# Patient Record
Sex: Male | Born: 1990 | Race: White | Hispanic: No | Marital: Single | Smoking: Former smoker
Health system: Southern US, Community
[De-identification: ages and names within clinical notes are randomized; demographics above are authoritative.]

## PROBLEM LIST (undated history)

## (undated) DIAGNOSIS — T7840XA Allergy, unspecified, initial encounter: Secondary | ICD-10-CM

## (undated) DIAGNOSIS — J45909 Unspecified asthma, uncomplicated: Secondary | ICD-10-CM

## (undated) DIAGNOSIS — L709 Acne, unspecified: Secondary | ICD-10-CM

## (undated) DIAGNOSIS — F17200 Nicotine dependence, unspecified, uncomplicated: Secondary | ICD-10-CM

## (undated) DIAGNOSIS — F419 Anxiety disorder, unspecified: Secondary | ICD-10-CM

## (undated) DIAGNOSIS — J189 Pneumonia, unspecified organism: Secondary | ICD-10-CM

## (undated) DIAGNOSIS — F341 Dysthymic disorder: Secondary | ICD-10-CM

## (undated) DIAGNOSIS — F909 Attention-deficit hyperactivity disorder, unspecified type: Secondary | ICD-10-CM

## (undated) HISTORY — DX: Allergy, unspecified, initial encounter: T78.40XA

## (undated) HISTORY — DX: Acne, unspecified: L70.9

## (undated) HISTORY — DX: Pneumonia, unspecified organism: J18.9

## (undated) HISTORY — DX: Anxiety disorder, unspecified: F41.9

## (undated) HISTORY — DX: Dysthymic disorder: F34.1

## (undated) HISTORY — DX: Attention-deficit hyperactivity disorder, unspecified type: F90.9

## (undated) HISTORY — DX: Nicotine dependence, unspecified, uncomplicated: F17.200

## (undated) HISTORY — DX: Unspecified asthma, uncomplicated: J45.909

---
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
## 2011-09-17 ENCOUNTER — Encounter (HOSPITAL_COMMUNITY): Payer: Self-pay

## 2011-09-17 ENCOUNTER — Emergency Department (HOSPITAL_COMMUNITY)
Admission: EM | Admit: 2011-09-17 | Discharge: 2011-09-17 | Disposition: A | Discharge status: Home / Self Care | Payer: 59 | Source: Home / Self Care

## 2011-09-17 DIAGNOSIS — J40 Bronchitis, not specified as acute or chronic: Secondary | ICD-10-CM

## 2011-09-17 LAB — POCT INFECTIOUS MONO SCREEN: Mono Screen: NEGATIVE

## 2011-09-17 MED ORDER — GUAIFENESIN-DM 100-10 MG/5ML PO SYRP: 5.0000 mL | ORAL_SOLUTION | Freq: Three times a day (TID) | ORAL | Status: AC | PRN

## 2011-09-17 MED ORDER — ONDANSETRON 4 MG PO TBDP: 4.0000 mg | ORAL_TABLET | Freq: Three times a day (TID) | ORAL | Status: AC | PRN

## 2011-09-17 MED ORDER — AZITHROMYCIN 250 MG PO TABS: 250.0000 mg | ORAL_TABLET | Freq: Every day | ORAL | Status: DC

## 2011-09-17 NOTE — Discharge Instructions (Signed)
Bronchitis Bronchitis is a problem of the air tubes leading to your lungs. This problem makes it hard for air to get in and out of the lungs. You may cough a lot because your air tubes are narrow. Going without care can cause lasting (chronic) bronchitis. HOME CARE   Drink enough fluids to keep your pee (urine) clear or pale yellow.   Use a cool mist humidifier.   Quit smoking if you smoke. If you keep smoking, the bronchitis might not get better.   Only take medicine as told by your doctor.  GET HELP RIGHT AWAY IF:   Coughing keeps you awake.   You start to wheeze.   You become more sick or weak.   You have a hard time breathing or get short of breath.   You cough up blood.   Coughing lasts more than 2 weeks.   You have a fever.   Your baby is older than 3 months with a rectal temperature of 102 F (38.9 C) or higher.   Your baby is 3 months old or younger with a rectal temperature of 100.4 F (38 C) or higher.  MAKE SURE YOU:  Understand these instructions.   Will watch your condition.   Will get help right away if you are not doing well or get worse.  Document Released: 09/14/2007 Document Revised: 03/17/2011 Document Reviewed: 02/27/2009 [employer] Patient Information 2012 [employer]. 

## 2011-09-17 NOTE — ED Notes (Signed)
Pt has fever, cough, vomiting and sorethroat that started at 3 pm today.

## 2011-09-17 NOTE — ED Provider Notes (Signed)
History     CSN: [id number]  Arrival date & time 09/17/11  1703   First MD Initiated Contact with Patient 09/17/11 1740      Chief Complaint  Patient presents with  . Sore Throat    (Consider location/radiation/quality/duration/timing/severity/associated sxs/prior treatment) HPI 21 year old male with history of depression presented with low-grade fever and malaise since yesterday.  Today he started having a cough which in turn caused him to have a vomiting episode.  He reports having productive sputum with phlegm.  He does complain of being slightly short of breath and weak.  Denies chest pain.  Did have diarrhea yesterday.  Reports that he smokes.  History reviewed. No pertinent past medical history.  History reviewed. No pertinent past surgical history.  History reviewed. No pertinent family history.  History  Substance Use Topics  . Smoking status: Current Everyday Smoker  . Smokeless tobacco: Not on file  . Alcohol Use: No      Review of Systems  Constitutional: Positive for fever and chills.  HENT: Positive for sore throat and trouble swallowing.   Eyes: Negative.   Respiratory: Positive for cough.   Cardiovascular: Negative.   Gastrointestinal: Negative.   Genitourinary: Negative.   Musculoskeletal: Negative.   Skin: Negative.   Neurological: Negative.   Hematological: Negative.   Psychiatric/Behavioral: Negative.     Allergies  Review of patient's allergies indicates no known allergies.  Home Medications   Current Outpatient Rx  Name Route Sig Dispense Refill  . VITAMIN D 1000 UNITS PO TABS Oral Take 1,000 Units by mouth daily.    Marland Kitchen MINOCYCLINE HCL 50 MG PO TABS Oral Take 50 mg by mouth 2 (two) times daily.    Marland Kitchen MIRTAZAPINE 15 MG PO TABS Oral Take 15 mg by mouth at bedtime.    . AZITHROMYCIN 250 MG PO TABS Oral Take 1 tablet (250 mg total) by mouth daily. Please take 2 tablets on the first day then one tablet daily thereafter. 6 each 0  .  GUAIFENESIN-DM 100-10 MG/5ML PO SYRP Oral Take 5 mLs by mouth 3 (three) times daily as needed for cough. 118 mL 0  . ONDANSETRON 4 MG PO TBDP Oral Take 1 tablet (4 mg total) by mouth every 8 (eight) hours as needed for nausea. 15 tablet 0    BP 118/66  Pulse 112  Temp(Src) 99.3 F (37.4 C) (Oral)  Resp 26  SpO2 100%  Physical Exam  Constitutional: He is oriented to person, place, and time. He appears well-developed and well-nourished.  HENT:  Head: Normocephalic and atraumatic.  Mouth/Throat: Oropharynx is clear and moist.       Erythema noted in the posterior oropharynx.  No exudates.  Eyes: Conjunctivae are normal. Pupils are equal, round, and reactive to light.  Neck: Normal range of motion. Neck supple.       Mild lymphadenopathy.  Cardiovascular: Normal rate and regular rhythm.   Pulmonary/Chest: Effort normal and breath sounds normal.  Abdominal: Soft. Bowel sounds are normal.  Genitourinary:       Not done.  Musculoskeletal: Normal range of motion.  Neurological: He is alert and oriented to person, place, and time.  Skin: Skin is warm.  Psychiatric: He has a normal mood and affect.    ED Course  Procedures (including critical care time)   Labs Reviewed  POCT INFECTIOUS MONO SCREEN  POCT RAPID STREP A ([facility] ONLY)   No results found.   1. Bronchitis  MDM  Rapid mono and strep negative.  Given persistent cough suspect patient has bronchitis likely due to viral etiology, patient given course of azithromycin with instructions not if his symptoms not improve with conservative management in the next few days he is to start taking the antibiotic.  Also suspect patient has post tussive vomiting, prescribe when necessary Zofran.  He was also instructed if his symptoms do not improve in the next 5-7 days he states him back to urgent care for further evaluation.      Cristal Ford, MD 09/17/11 Ernest[facility] [place]

## 2012-06-13 ENCOUNTER — Encounter: Payer: Self-pay | Admitting: *Deleted

## 2012-07-06 ENCOUNTER — Encounter: Payer: Self-pay | Admitting: Medical

## 2012-07-06 ENCOUNTER — Ambulatory Visit (INDEPENDENT_AMBULATORY_CARE_PROVIDER_SITE_OTHER): Payer: 59 | Admitting: Medical

## 2012-07-06 VITALS — BP 90/60 | HR 88 | Temp 98.0°F | Resp 18 | Wt 130.0 lb

## 2012-07-06 DIAGNOSIS — Z202 Contact with and (suspected) exposure to infections with a predominantly sexual mode of transmission: Secondary | ICD-10-CM

## 2012-07-06 DIAGNOSIS — R21 Rash and other nonspecific skin eruption: Secondary | ICD-10-CM

## 2012-07-06 DIAGNOSIS — K130 Diseases of lips: Secondary | ICD-10-CM

## 2012-07-06 DIAGNOSIS — Z7251 High risk heterosexual behavior: Secondary | ICD-10-CM

## 2012-07-06 DIAGNOSIS — Z2089 Contact with and (suspected) exposure to other communicable diseases: Secondary | ICD-10-CM

## 2012-07-06 DIAGNOSIS — N489 Disorder of penis, unspecified: Secondary | ICD-10-CM

## 2012-07-06 LAB — CBC WITH DIFFERENTIAL/PLATELET
Basophils Relative: 0 % (ref 0–1)
Hemoglobin: 15.2 g/dL (ref 13.0–17.0)
Lymphs Abs: 1.6 10*3/uL (ref 0.7–4.0)
Monocytes Relative: 9 % (ref 3–12)
Neutro Abs: 2.5 10*3/uL (ref 1.7–7.7)
Neutrophils Relative %: 53 % (ref 43–77)
RBC: 5.07 MIL/uL (ref 4.22–5.81)

## 2012-07-06 NOTE — Progress Notes (Signed)
Subjective:  Curtis Howard is a 22 y.o. male who presents as a new patient today.   He reports concern for STD.  He notes few day hx/o skin lesions on his penis.   He has hx/o 10 sexual partners, 1 serious partner the last 5 months, but he and girlfriend broke up for a short period where he had intercourse with a different partner unprotected.   He has had recent intercourse with current partner of 76mo using condoms though since the skin lesions started.  He does report some burning with urination, left testicle and penis has been itching.  He takes a bath every other day.  Cleans his bed sheets about once per month.  He lives with a roommate.  Currently he is not in school, is not working. He also has concerns about rash in left corner of mouth which he thinks is cheilitis.  No prior similar, using neosporin cream.  No other aggravating or relieving factors.    No other c/o.  The following portions of the patient's history were reviewed and updated as appropriate: allergies, current medications, past family history, past medical history, past social history, past surgical history and problem list.  ROS Otherwise as in subjective above  Objective: Physical Exam  Vital signs reviewed  General appearance: alert, no distress, WD/WN, lean white male HEENT: normocephalic, sclerae anicteric, conjunctiva pink and moist, TMs pearly, nares patent, no discharge or erythema, pharynx normal Oral cavity: MMM, no lesions, left corner of mouth with cracking and erythema c/w cheilitis, lips appear chapped in general Neck: supple, no lymphadenopathy, no thyromegaly, no masses Abdomen: +bs, soft, non tender, non distended, no masses, no hepatomegaly, no splenomegaly GU: shoddy tender lymph nodes bilat inguinal region, penis with several small (8-10, small 1-3mm round dark red crusted lesions somewhat of ulcerated appearance suggestive of healing scabs.  No obvious vesicles, no patches of erythematous base, no  obvious molluscum either, otherwise circumcised penis normal appearing, testicles nontender, no swelling, no mass, no hernia Skin : otherwise unremarkable Pulses: 2+ radial pulses, 2+ pedal pulses, normal cap refill   Assessment: Encounter Diagnoses  Name Primary?  . Angular cheilitis Yes  . Venereal disease contact   . Penile rash   . High risk sexual behavior     Plan: Angular cheilitis - advised OTC Burts Bees Wax lip balm for dry lips, avoid licking corners of mouth, can try OTC antifungal cream on the left ankle of lip, should resolve in 1-2 wk, but recheck if not improving  Discussed his skin findings, concerns, lack of condom use in general.  The penile lesions could be herpes, scabies, other, etiology not clear.  Not classical herpes or molluscum appearance.  No other scabies like skin lesions on rest of body.  Viral culture swab and labs drawn today.  STD screening today.  Advised that skin lesions are nonspecific.  Can't rule out herpes or scabies.  Advised abstinence for now until we get results.  Discussed means of transmission of STDs, symptoms, findings on exam, possible treatment going forward, and prevention, condom use.  Follow up: pending labs.  Addendum: he apparently left without giving urine specimen.  We will contact him to come back for this.

## 2012-07-07 ENCOUNTER — Encounter: Payer: Self-pay | Admitting: Medical

## 2012-07-09 LAB — HSV(HERPES SMPLX)ABS-I+II(IGG+IGM)-BLD
HSV 2 Glycoprotein G Ab, IgG: <0.1 IV
Herpes Simplex Vrs I&II-IgM Ab (EIA): 0.74 INDEX

## 2012-07-12 ENCOUNTER — Telehealth: Payer: Self-pay

## 2012-07-12 NOTE — Telephone Encounter (Signed)
OPEN IN ERROR 

## 2012-07-16 ENCOUNTER — Telehealth: Payer: Self-pay | Admitting: Internal Medicine

## 2012-07-16 NOTE — Telephone Encounter (Signed)
The herpes culture swab was invalid/not useful, but the blood tests were all negative.  Thus, at this point, the labs suggest that his lesions were not herpes.   If he still has the same sores/rash on penis WITHOUT blisters/fluid filled bumps, then lets assume this is likely scabies/biting insects and treat with Permethrin cream.  Let me know so i can send the cream .  If the rash has all resolved then never mind about the cream.  In general I recommend condom use.

## 2012-07-16 NOTE — Telephone Encounter (Signed)
Pt is calling wanting the rest of his lab results to his STD screening

## 2012-07-17 NOTE — Telephone Encounter (Signed)
Patient is aware of his lab results. CLS 

## 2013-04-01 ENCOUNTER — Encounter: Payer: Self-pay | Admitting: Medical

## 2013-04-01 ENCOUNTER — Ambulatory Visit (INDEPENDENT_AMBULATORY_CARE_PROVIDER_SITE_OTHER): Payer: 59 | Admitting: Medical

## 2013-04-01 VITALS — BP 100/68 | HR 88 | Temp 98.3°F | Resp 16 | Wt 148.0 lb

## 2013-04-01 DIAGNOSIS — R110 Nausea: Secondary | ICD-10-CM

## 2013-04-01 DIAGNOSIS — G4700 Insomnia, unspecified: Secondary | ICD-10-CM

## 2013-04-01 MED ORDER — ONDANSETRON HCL 4 MG PO TABS: 4.0000 mg | ORAL_TABLET | Freq: Three times a day (TID) | ORAL | Status: DC | PRN

## 2013-04-01 MED ORDER — TRAZODONE HCL 50 MG PO TABS: ORAL_TABLET | ORAL | Status: DC

## 2013-04-01 NOTE — Patient Instructions (Signed)
Insomnia Insomnia is frequent trouble falling and/or staying asleep. Insomnia can be a long term problem or a short term problem. Both are common. Insomnia can be a short term problem when the wakefulness is related to a certain stress or worry. Long term insomnia is often related to ongoing stress during waking hours and/or poor sleeping habits. Overtime, sleep deprivation itself can make the problem worse. Every little thing feels more severe because you are overtired and your ability to cope is decreased.  CAUSES   Stress, anxiety, and depression.  Poor sleeping habits.  Distractions such as TV in the bedroom.  Naps close to bedtime.  Engaging in emotionally charged conversations before bed.  Technical reading before sleep.  Alcohol and other sedatives. They may make the problem worse. They can hurt normal sleep patterns and normal dream activity.  Stimulants such as caffeine for several hours prior to bedtime.  Pain syndromes and shortness of breath can cause insomnia.  Exercise late at night.  Changing time zones may cause sleeping problems (jet lag).  It is sometimes helpful to have someone observe your sleeping patterns. They should look for periods of not breathing during the night (sleep apnea). They should also look to see how long those periods last. If you live alone or observers are uncertain, you can also be observed at a sleep clinic where your sleep patterns will be professionally monitored. Sleep apnea requires a checkup and treatment. Give your caregivers your medical history. Give your caregivers observations your family has made about your sleep.   SYMPTOMS   Not feeling rested in the morning.  Anxiety and restlessness at bedtime.  Difficulty falling and staying asleep.  TREATMENT   Your caregiver may prescribe treatment for an underlying medical disorders. Your caregiver can give advice or help if you are using alcohol or other drugs for self-medication.  Treatment of underlying problems will usually eliminate insomnia problems.  Medications can be prescribed for short time use. They are generally not recommended for lengthy use.  Over-the-counter sleep medicines are not recommended for lengthy use. They can be habit forming.  You can promote easier sleeping by making lifestyle changes such as the following:  Sleep hygiene  Sleep only as much as you need to feel rested and then get out of bed  Keep a regular sleep schedule.  Aim to go to bed at the same time every night, and set an alarm clock to wake up at a fixed time each morning including weekends  Develop a bedtime ritual. Keep a familiar routine of bathing, brushing your teeth, climbing into bed at the same time each night, listening to soothing music. Routines increase the success of falling to sleep faster.  Use relaxation techniques. This can be using breathing and muscle tension release routines. It can also include visualizing peaceful scenes. You can also help control troubling or intruding thoughts by keeping your mind occupied with boring or repetitive thoughts like the old concept of counting sheep. You can make it more creative like imagining planting one beautiful flower after another in your backyard garden.  During your day, work to eliminate stress. When this is not possible use some of the previous suggestions to help reduce the anxiety that accompanies stressful situations.  Avoid forcing sleep  Exercise regularly at least 20 minutes, preferably 4-5 hours before bedtime  Avoid caffeinated beverages after lunch  Avoid alcohol near bedtime; no "night cap"  Avoid smoking, especially in the evening  Do not go to bed  hungry; work on [employer] and the time of your last meal. No night time snacks.  Adjust bedroom environment to a cool, quiet, dark room  Deal with you worries before bedtime.  Consider counseling for excessive stress, worry, and life situations.   I can provide resources and contact information for counselors if needed.  Stimulus control  Go to bed only when sleepy  Do not watch television, read, eat, or worry while in bed.  Use bed only for sleep and sex  Stop tedious detailed work at least one hour before bedtime.  Get out of the bed if unable to fall asleep within 20 minutes and go to another room.  Return to bed only when sleepy.  Read or do some quiet activity. Keep the lights down. Wait until you feel sleepy and go back to bed.Repeat this step as many times as necessary throughout the night  Do not take a nap during the day   SLEEP DIARY   Keep a diary. Inform your caregiver about your progress. This includes any medication side effects. See your caregiver regularly. Take note of:  Times when you are asleep.  Times when you are awake during the night.  The quality of your sleep.  How you feel the next day.  This information will help your caregiver care for you.  Bring your sleep diary in at the next visit    Gastroesophageal Reflux Disease, Adult Gastroesophageal reflux disease (GERD) happens when acid from your stomach flows up into the esophagus. When acid comes in contact with the esophagus, the acid causes soreness (inflammation) in the esophagus. Over time, GERD may create small holes (ulcers) in the lining of the esophagus. CAUSES   Increased body weight. This puts pressure on the stomach, making acid rise from the stomach into the esophagus.   Smoking. This increases acid production in the stomach.   Drinking alcohol. This causes decreased pressure in the lower esophageal sphincter (valve or ring of muscle between the esophagus and stomach), allowing acid from the stomach into the esophagus.   Late evening meals and a full stomach. This increases pressure and acid production in the stomach.   A malformed lower esophageal sphincter.  Sometimes, no cause is found. SYMPTOMS   Burning pain in the lower  part of the mid-chest behind the breastbone and in the mid-stomach area. This may occur twice a week or more often.   Trouble swallowing.   Sore throat.   Dry cough.   Asthma-like symptoms including chest tightness, shortness of breath, or wheezing.  DIAGNOSIS  Your caregiver may be able to diagnose GERD based on your symptoms. In some cases, X-rays and other tests may be done to check for complications or to check the condition of your stomach and esophagus. TREATMENT  Your caregiver may recommend over-the-counter or prescription medicines to help decrease acid production. Ask your caregiver before starting or adding any new medicines.  HOME CARE INSTRUCTIONS   Change the factors that you can control. Ask your caregiver for guidance concerning weight loss, quitting smoking, and alcohol consumption.   Avoid foods and drinks that make your symptoms worse, such as:   Caffeine or alcoholic drinks.   Chocolate.   Peppermint or mint flavorings.   Garlic and onions.   Spicy foods.   Citrus fruits, such as oranges, lemons, or limes.   Tomato-based foods such as sauce, chili, salsa, and pizza.   Fried and fatty foods.   Avoid lying down for the  3 hours prior to your bedtime or prior to taking a nap.   Eat small, frequent meals instead of large meals.   Wear loose-fitting clothing. Do not wear anything tight around your waist that causes pressure on your stomach.   Raise the head of your bed 6 to 8 inches with wood blocks to help you sleep. Extra pillows will not help.   Only take over-the-counter or prescription medicines for pain, discomfort, or fever as directed by your caregiver.   Do not take aspirin, ibuprofen, or other nonsteroidal anti-inflammatory drugs (NSAIDs).  SEEK IMMEDIATE MEDICAL CARE IF:   You have pain in your arms, neck, jaw, teeth, or back.   Your pain increases or changes in intensity or duration.   You develop nausea, vomiting, or sweating  (diaphoresis).   You develop shortness of breath, or you faint.   Your vomit is green, yellow, black, or looks like coffee grounds or blood.   Your stool is red, bloody, or black.  These symptoms could be signs of other problems, such as heart disease, gastric bleeding, or esophageal bleeding. MAKE SURE YOU:   Understand these instructions.   Will watch your condition.   Will get help right away if you are not doing well or get worse.  Document Released: 01/05/2005 Document Revised: 12/08/2010 Document Reviewed: 10/15/2010 [facility] Patient Information 2012 [place], [place].

## 2013-04-01 NOTE — Progress Notes (Signed)
Subjective: Here for symptoms.   He notes having some nausea randomly.  In the past has used zofran for this prn.   He notes sleep problems, wants a non narcotic sleep aid.  Works in [employer], and stocks in a store.  Work schedule is consistent.  Lives with 1 room mate.  Doesn't eat in the mornigns when he first gets up.  Eats late at night.  Eats 2-3 meals daily, but not necessarily consistent.  Sometimes eats - 1 hour from bedtime.  Exercise - none other than active on the job.   Usually goes to bed 4am, gets up 11am - 1pm.  Denies other symptoms.  No other c/o.  Review of Systems Constitutional: -fever, -chills, -sweats, -unexpected weight change, +fatigue ENT: -runny nose, -ear pain, -sore throat Cardiology:  -chest pain, -palpitations, -edema Respiratory: -cough, -shortness of breath, -wheezing Gastroenterology: -abdominal pain,  -vomiting, -diarrhea, -constipation  Hematology: -bleeding or bruising problems Musculoskeletal: -arthralgias, -myalgias, -joint swelling, -back pain Ophthalmology: -vision changes Urology: -dysuria, -difficulty urinating, -hematuria, -urinary frequency, -urgency Neurology: -headache, -weakness, -tingling, -numbness   Objective: Filed Vitals:   04/01/13 1420  BP: 100/68  Pulse: 88  Temp: 98.3 F (36.8 C)  Resp: 16    General appearance: alert, no distress, WD/WN HEENT: normocephalic, sclerae anicteric, TMs pearly, nares patent, no discharge or erythema, pharynx normal Oral cavity: MMM, no lesions Neck: supple, no lymphadenopathy, no thyromegaly, no masses Heart: RRR, normal S1, S2, no murmurs Lungs: CTA bilaterally, no wheezes, rhonchi, or rales Abdomen: +bs, soft, non tender, non distended, no masses, no hepatomegaly, no splenomegaly Pulses: 2+ symmetric, upper and lower extremities, normal cap refill  Assessment: Encounter Diagnoses  Name Primary?  . Nausea alone Yes  . Insomnia     Plan: Discussed his symptoms, concerns.  His  sleep and nausea concerns are both likely related to his inconsistent scheduled. Discussed sleep hygiene, GERD avoidance, healthy diet trying to get some consistency in both diet, meal timing, sleep schedule.  Begin zofran prn.  I advised that I don't want him on Trazodone long term, but we can use short term while he works on his schedule and sleep hygiene.  He was on trazodone prior in remote past. Follow-up 51mo

## 2013-05-12 ENCOUNTER — Encounter (HOSPITAL_COMMUNITY): Payer: Self-pay | Admitting: Emergency Medicine

## 2013-05-12 ENCOUNTER — Emergency Department (HOSPITAL_COMMUNITY)
Admission: EM | Admit: 2013-05-12 | Discharge: 2013-05-12 | Disposition: A | Discharge status: Home / Self Care | Payer: 59 | Source: Home / Self Care

## 2013-05-12 DIAGNOSIS — H109 Unspecified conjunctivitis: Secondary | ICD-10-CM

## 2013-05-12 MED ORDER — ERYTHROMYCIN 5 MG/GM OP OINT: TOPICAL_OINTMENT | OPHTHALMIC | Status: DC

## 2013-05-12 MED ORDER — TETRACAINE HCL 0.5 % OP SOLN
2.0000 [drp] | Freq: Once | OPHTHALMIC | Status: AC
  Administered 2013-05-12: 2 [drp] via OPHTHALMIC

## 2013-05-12 MED ORDER — TETRACAINE HCL 0.5 % OP SOLN
OPHTHALMIC | Status: AC
  Filled 2013-05-12: qty 2

## 2013-05-12 NOTE — Discharge Instructions (Signed)

## 2013-05-12 NOTE — ED Notes (Signed)
C/o right eye swelling. Irritation.  Drainage. And redness since Thursday.  Gradually getting worse.  Pt has used ofloxacin with no relief.

## 2013-05-12 NOTE — ED Provider Notes (Signed)
Medical screening examination/treatment/procedure(s) were performed by non-physician practitioner and as supervising physician I was immediately available for consultation/collaboration.  Leslee Homeavid Willine Schwalbe, M.D.  Reuben Likesavid C Ashanti Littles, MD 05/12/13 [phone]

## 2013-05-12 NOTE — ED Provider Notes (Signed)
CSN: [id number]     Arrival date & time 05/12/13  1422 History   None    Chief Complaint  Patient presents with  . Eye Drainage   (Consider location/radiation/quality/duration/timing/severity/associated sxs/prior Treatment)  HPI  Patient is a 23 year old male presenting today with reports of eye pain swelling and itching since Wednesday this past week. Patient states he was seen at his primary eye doctor and was prescribed antibiotic eye drops. Patient states that eye drops sting and burn when he places them in his eye. However, he has continued to use them, although his eyes have become significantly worse with consistent use.   Past Medical History  Diagnosis Date  . Pneumonia     h/o  . Reactive airway disease   . Tobacco use disorder   . Acne   . ADHD (attention deficit hyperactivity disorder)   . Allergy   . Asthma   . Anxiety   . Dysthymic disorder    Past Surgical History  Procedure Laterality Date  . Wisdom tooth extraction  2012   Family History  Problem Relation Age of Onset  . Diabetes Mother    History  Substance Use Topics  . Smoking status: Current Every Day Smoker  . Smokeless tobacco: Not on file  . Alcohol Use: No    Review of Systems  Constitutional: Negative.   HENT: Negative.   Eyes: Positive for pain, discharge, redness and itching. Negative for photophobia and visual disturbance.  Respiratory: Negative.   Cardiovascular: Negative.   Gastrointestinal: Negative.   Endocrine: Negative.   Genitourinary: Negative.   Allergic/Immunologic: Negative.   Neurological: Negative.   Hematological: Negative.   Psychiatric/Behavioral:       The patient has a history of substance abuse and requests no narcotic medications.    Allergies  Review of patient's allergies indicates no known allergies.  Home Medications   Current Outpatient Rx  Name  Route  Sig  Dispense  Refill  . minocycline (DYNACIN) 50 MG tablet   Oral   Take 50 mg by mouth 2 (two)  times daily.         . traZODone (DESYREL) 50 MG tablet      1/2 tablet po QHS   30 tablet   0   . erythromycin ophthalmic ointment      Place a 1/2 inch ribbon of ointment into the lower eyelid.   1 g   0   . ondansetron (ZOFRAN) 4 MG tablet   Oral   Take 1 tablet (4 mg total) by mouth every 8 (eight) hours as needed for nausea or vomiting.   20 tablet   0    BP 115/69  Pulse 79  Temp(Src) 98 F (36.7 C) (Oral)  Resp 18  SpO2 99%  Physical Exam  Nursing note and vitals reviewed. Constitutional: He appears well-developed and well-nourished. No distress.  HENT:  Head: Normocephalic and atraumatic.  Eyes: EOM are normal. Pupils are equal, round, and reactive to light. Right eye exhibits discharge. Left eye exhibits no discharge. No scleral icterus.  Fundoscopic exam:      The right eye shows exudate. The right eye shows no AV nicking and no hemorrhage. The right eye shows red reflex.       The left eye shows no AV nicking, no exudate and no hemorrhage. The left eye shows red reflex.  Slit lamp exam:      The right eye shows no corneal abrasion, no corneal ulcer and no foreign body.  The patient reports no pain with extraocular movements. The patient denies visual changes or disturbances.  Patient's right eye is significantly injected.  Skin: He is not diaphoretic.    ED Course  Procedures (including critical care time) Labs Review Labs Reviewed - No data to display Imaging Review No results found.    MDM   1. Conjunctivitis    Meds ordered this encounter  Medications  . tetracaine (PONTOCAINE) 0.5 % ophthalmic solution 2 drop    Sig:   . erythromycin ophthalmic ointment    Sig: Place a 1/2 inch ribbon of ointment into the lower eyelid.    Dispense:  1 g    Refill:  0   The patient to discontinue use of antibiotic eyedrops and keep appointment with primary eye physician tomorrow at 3 PM. Erythromycin ointment and eye patch prescribed for comfort.  The patient and mother verbalized understanding of plan of care.    Weber Cooksatherine Dan Dissinger, NP 05/12/13 [phone]

## 2013-06-20 ENCOUNTER — Telehealth: Payer: Self-pay | Admitting: Internal Medicine

## 2013-06-20 NOTE — Telephone Encounter (Signed)
[facility] 50mg  [facility]

## 2013-06-21 ENCOUNTER — Other Ambulatory Visit: Payer: Self-pay | Admitting: Medical

## 2013-06-21 MED ORDER — TRAZODONE HCL 50 MG PO TABS: ORAL_TABLET | ORAL | Status: DC

## 2013-06-21 NOTE — Telephone Encounter (Signed)
rx sent, set up for CPX

## 2013-06-21 NOTE — Telephone Encounter (Signed)
Pt notified that med has been sent. Pt will call back and schedule and appt

## 2013-11-11 ENCOUNTER — Telehealth: Payer: Self-pay | Admitting: Medical

## 2013-11-11 NOTE — Telephone Encounter (Signed)
Called - appt made  

## 2013-11-11 NOTE — Telephone Encounter (Signed)
Needs CPX 

## 2013-11-14 ENCOUNTER — Encounter: Payer: Self-pay | Admitting: Medical

## 2013-11-14 ENCOUNTER — Ambulatory Visit (INDEPENDENT_AMBULATORY_CARE_PROVIDER_SITE_OTHER): Payer: 59 | Admitting: Medical

## 2013-11-14 VITALS — BP 110/52 | HR 66 | Temp 98.2°F | Resp 15 | Wt 130.0 lb

## 2013-11-14 DIAGNOSIS — Z113 Encounter for screening for infections with a predominantly sexual mode of transmission: Secondary | ICD-10-CM

## 2013-11-14 DIAGNOSIS — G4700 Insomnia, unspecified: Secondary | ICD-10-CM

## 2013-11-14 DIAGNOSIS — R110 Nausea: Secondary | ICD-10-CM

## 2013-11-14 MED ORDER — OMEPRAZOLE 40 MG PO CPDR: 40.0000 mg | DELAYED_RELEASE_CAPSULE | Freq: Every day | ORAL | Status: DC

## 2013-11-14 MED ORDER — ONDANSETRON HCL 4 MG PO TABS: 4.0000 mg | ORAL_TABLET | Freq: Three times a day (TID) | ORAL | Status: DC | PRN

## 2013-11-14 MED ORDER — TRAZODONE HCL 50 MG PO TABS: ORAL_TABLET | ORAL | Status: DC

## 2013-11-14 NOTE — Progress Notes (Signed)
Subjective: Here for recheck on insomnia, nausea, and wants STD testing.    Using Trazodone for sleep which works fine.   He tries to have consistent sleep and wake time, but he still sometimes eats late, still gets home late at times.  He still lives with room mate.  Exercises some.    Chronic nausea - still gets intermittent nausea.  Skips breakfast, eats with no discretion, does report eating some high sugar foods. Denies heartburn.  No vomiting, no fever.  He notes weigh fluctuates with activity and diet.  He denies unexplained weight loss.  No bleeding, bruising, anorexia, vomiting, belly or back pain.  No prior GI eval.  Wants STD testing.  Last testing earlier this year normal, but has had new partners, sometimes unprotected.    Review of Systems Constitutional: -fever, -chills, -sweats, -unexpected weight change, +fatigue ENT: -runny nose, -ear pain, -sore throat Cardiology:  -chest pain, -palpitations, -edema Respiratory: -cough, -shortness of breath, -wheezing Gastroenterology: -abdominal pain,  -vomiting, -diarrhea, -constipation  Hematology: -bleeding or bruising problems Musculoskeletal: -arthralgias, -myalgias, -joint swelling, -back pain Ophthalmology: -vision changes Urology: -dysuria, -difficulty urinating, -hematuria, -urinary frequency, -urgency Neurology: -headache, -weakness, -tingling, -numbness   Objective: Filed Vitals:   11/14/13 1354  BP: 110/52  Pulse: 66  Temp: 98.2 F (36.8 C)  Resp: 15    General appearance: alert, no distress, WD/WN, lean white male HEENT: normocephalic, sclerae anicteric, TMs pearly, nares patent, no discharge or erythema, pharynx normal Oral cavity: MMM, no lesions Neck: supple, no lymphadenopathy, no thyromegaly, no masses Heart: RRR, normal S1, S2, no murmurs Lungs: CTA bilaterally, no wheezes, rhonchi, or rales Abdomen: +bs, soft, non tender, non distended, no masses, no hepatomegaly, no splenomegaly Pulses: 2+ symmetric,  upper and lower extremities, normal cap refill Neuro: nonfocal exam Pscyh: pleasant, answers questions appropriately   Assessment: Encounter Diagnoses  Name Primary?  . Chronic nausea Yes  . Insomnia   . Screen for STD (sexually transmitted disease)     Plan: Chronic nausea - discussed causes.  zofran prn.  Begin trial of Omeprazole.   discussed bland diet, avoid spicy and acidic foods, call back in 2wk  Insomnia - c/t trazodone  STD screening. Discussed safe sex, prevention.

## 2013-11-15 LAB — RPR

## 2013-11-15 LAB — GC/CHLAMYDIA PROBE AMP
CT Probe RNA: NEGATIVE
GC Probe RNA: NEGATIVE

## 2013-11-15 LAB — HIV ANTIBODY (ROUTINE TESTING W REFLEX): HIV: NONREACTIVE

## 2018-05-15 ENCOUNTER — Other Ambulatory Visit: Payer: Self-pay | Admitting: Orthopedic Surgery

## 2018-05-15 DIAGNOSIS — R609 Edema, unspecified: Secondary | ICD-10-CM

## 2018-05-18 ENCOUNTER — Ambulatory Visit
Admission: RE | Admit: 2018-05-18 | Discharge: 2018-05-18 | Disposition: A | Discharge status: Home / Self Care | Payer: BLUE CROSS/BLUE SHIELD | Source: Ambulatory Visit | Attending: Orthopedic Surgery | Admitting: Orthopedic Surgery

## 2018-05-18 DIAGNOSIS — R609 Edema, unspecified: Secondary | ICD-10-CM

## 2018-06-11 ENCOUNTER — Other Ambulatory Visit: Payer: Self-pay | Admitting: Orthopedic Surgery

## 2018-06-11 DIAGNOSIS — R229 Localized swelling, mass and lump, unspecified: Principal | ICD-10-CM

## 2018-06-11 DIAGNOSIS — IMO0002 Reserved for concepts with insufficient information to code with codable children: Secondary | ICD-10-CM

## 2018-06-13 ENCOUNTER — Other Ambulatory Visit: Payer: BLUE CROSS/BLUE SHIELD

## 2018-06-17 ENCOUNTER — Inpatient Hospital Stay: Admission: RE | Admit: 2018-06-17 | Payer: BLUE CROSS/BLUE SHIELD | Source: Ambulatory Visit

## 2018-06-17 ENCOUNTER — Ambulatory Visit
Admission: RE | Admit: 2018-06-17 | Discharge: 2018-06-17 | Disposition: A | Discharge status: Home / Self Care | Payer: BLUE CROSS/BLUE SHIELD | Source: Ambulatory Visit | Attending: Orthopedic Surgery | Admitting: Orthopedic Surgery

## 2018-06-17 DIAGNOSIS — R229 Localized swelling, mass and lump, unspecified: Principal | ICD-10-CM

## 2018-06-17 DIAGNOSIS — IMO0002 Reserved for concepts with insufficient information to code with codable children: Secondary | ICD-10-CM

## 2018-06-17 MED ORDER — GADOBENATE DIMEGLUMINE 529 MG/ML IV SOLN
12.0000 mL | Freq: Once | INTRAVENOUS | Status: AC | PRN
  Administered 2018-06-17: 12 mL via INTRAVENOUS

## 2018-07-11 HISTORY — PX: HAND SURGERY: SHX662

## 2019-09-06 ENCOUNTER — Telehealth: Payer: Self-pay | Admitting: Adult Health Nurse Practitioner

## 2019-09-06 NOTE — Telephone Encounter (Signed)
CALLED Pt LVMTCB

## 2019-12-21 IMAGING — MR MRI OF THE RIGHT FINGERS WITHOUT AND WITH CONTRAST
8 series · 40 of 40 positions shown · IV contrast (multihance)
Comparison: Right hand ultrasound dated May 18, 2018.

CLINICAL DATA: Middle finger and palmar mass that began after
developing a cut between the third and fourth fingers in August 2017.

EXAM:
MRI OF THE RIGHT FINGERS WITHOUT AND WITH CONTRAST
TECHNIQUE: Multiplanar, multisequence MR imaging of the right hand was
performed before and after the administration of intravenous
contrast.
CONTRAST:  12mL MULTIHANCE GADOBENATE DIMEGLUMINE 529 MG/ML IV SOLN

[Series 3: T2 fat-sat · coronal · right · 2.0mm · 0.28mm/px · 3 of 23 slices shown (1 of 2)]
[im 1/23]
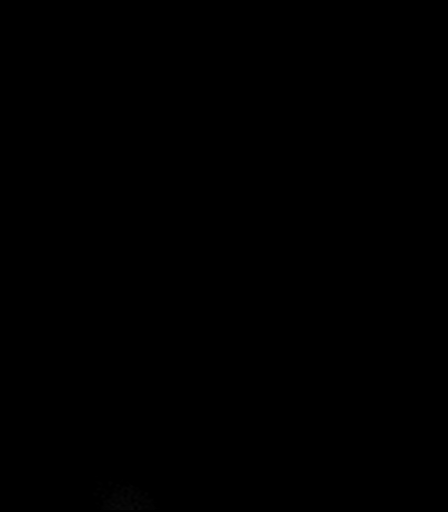
[im 12/23]
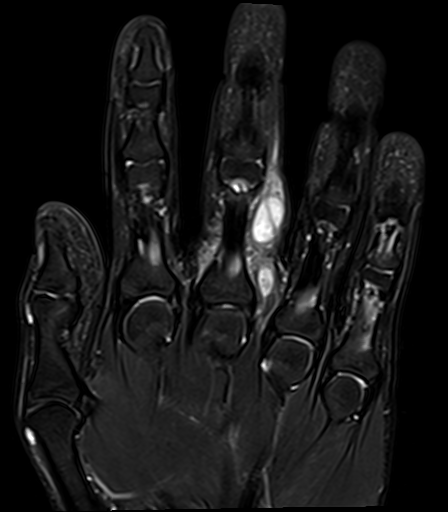
[im 23/23]
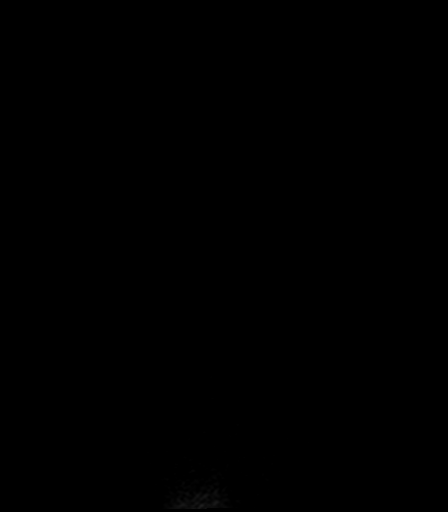

[Series 4: T1 · coronal · right · 2.0mm · 0.57mm/px · 3 of 23 slices shown]
[im 1/23]
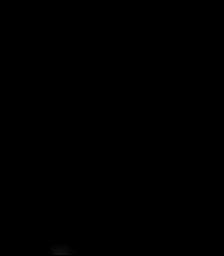
[im 12/23]
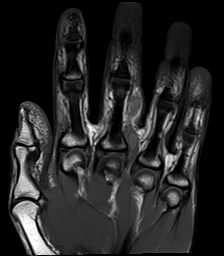
[im 23/23]
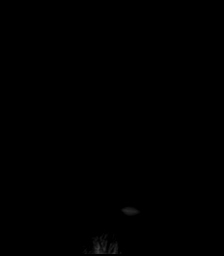

[Series 5: t1_ax · axial · right · 3.0mm · 0.31mm/px · z∈[-53,+97]mm · 7 of 44 slices shown]
[im 1/44]
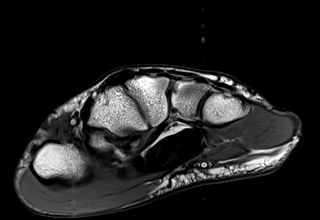
[im 8/44]
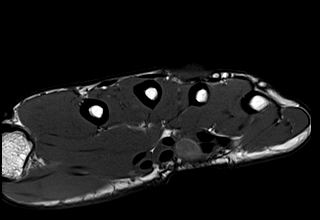
[im 15/44]
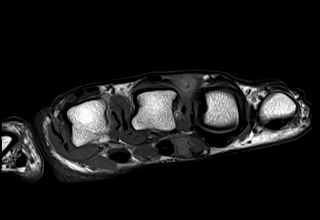
[im 22/44]
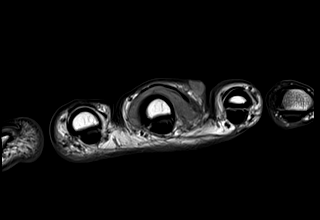
[im 29/44]
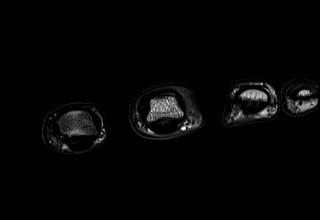
[im 36/44]
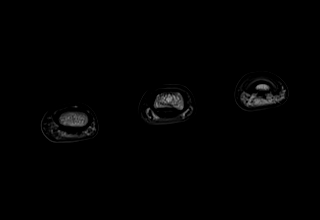
[im 44/44]
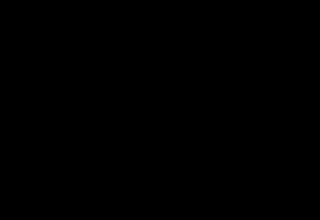

[Series 7: T2 fat-sat · axial · right · 3.0mm · 0.31mm/px · z∈[-53,+97]mm · 7 of 44 slices shown (2 of 2)]
[im 1/44]
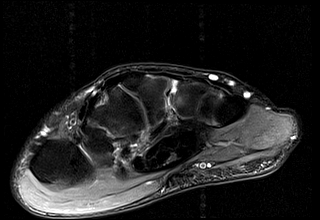
[im 8/44]
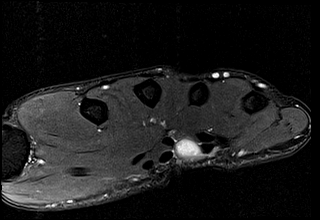
[im 15/44]
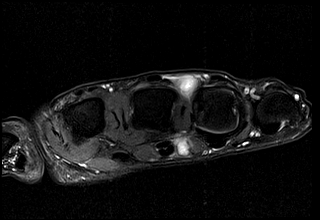
[im 22/44]
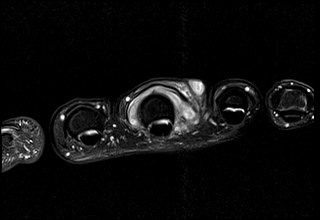
[im 29/44]
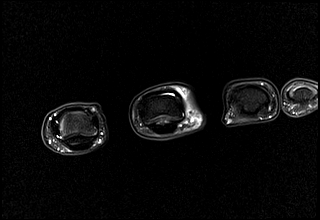
[im 36/44]
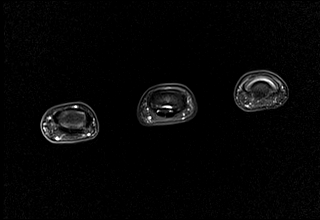
[im 44/44]
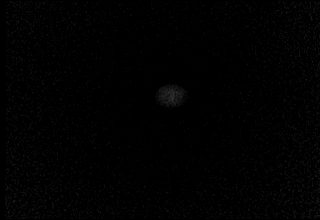

[Series 8: t1_ax fs · axial · right · 3.0mm · 0.31mm/px · z∈[-53,+97]mm · 7 of 44 slices shown (1 of 2)]
[im 1/44]
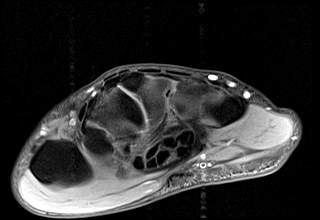
[im 8/44]
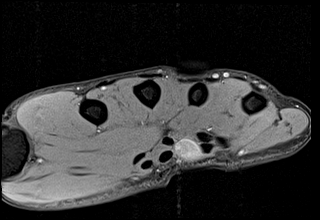
[im 15/44]
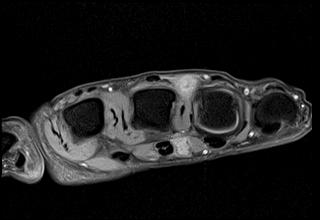
[im 22/44]
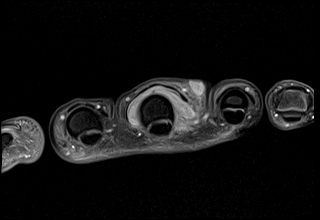
[im 29/44]
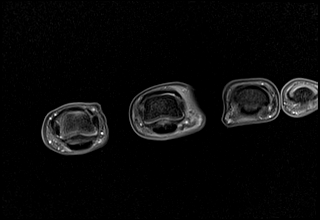
[im 36/44]
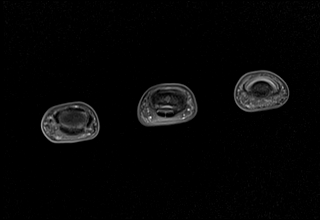
[im 44/44]
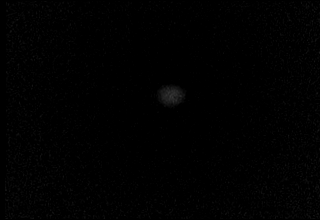

[Series 9: PD fat-sat · sagittal · right · 3.0mm · 0.38mm/px · 2 of 14 slices shown]
[im 1/14]
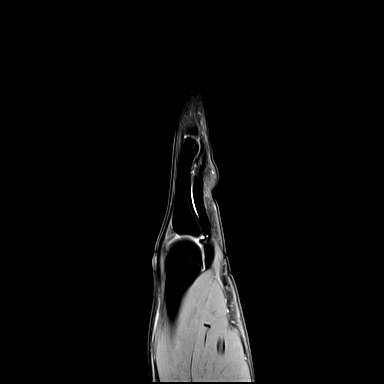
[im 14/14]
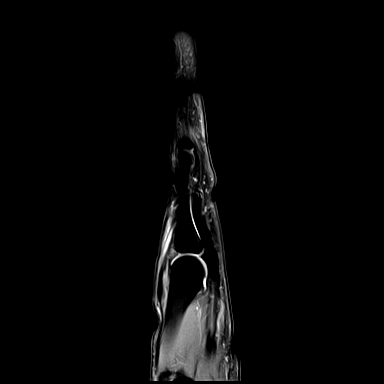

[Series 10: t1_ax fs · axial · right · 3.0mm · 0.31mm/px · z∈[-53,+101]mm · 7 of 45 slices shown (2 of 2)]
[im 1/45]
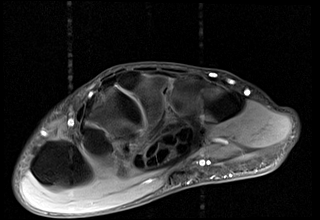
[im 8/45]
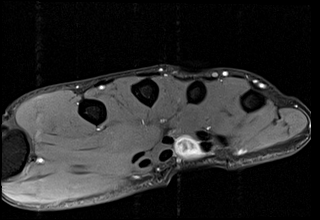
[im 15/45]
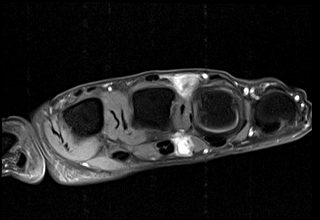
[im 23/45]
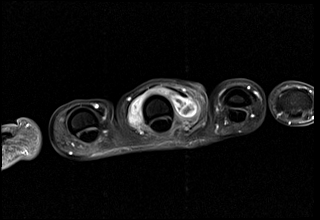
[im 30/45]
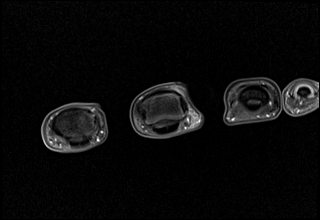
[im 37/45]
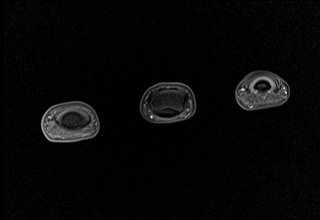
[im 45/45]
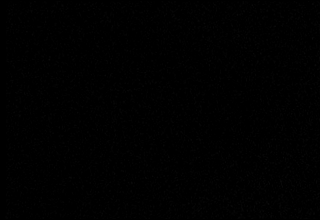

[Series 11: T1 fat-sat · coronal · right · 2.0mm · 0.57mm/px · 4 of 23 slices shown]
[im 1/23]
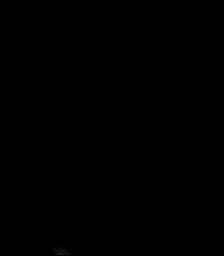
[im 8/23]
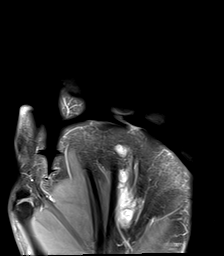
[im 15/23]
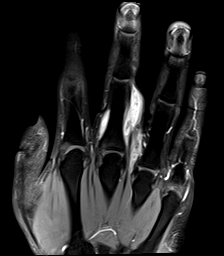
[im 23/23]
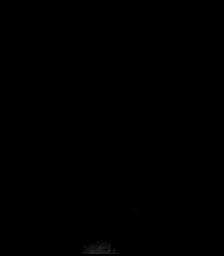

[40 of 40 positions shown; findings below may reference images not displayed]

FINDINGS: Bones/Joint/Cartilage

No marrow signal abnormality. No fracture or dislocation. Normal
alignment. No joint effusion.

Ligaments

Collateral ligaments are intact.

Muscles and Tendons
Flexor, and extensor tendons are intact. No muscle edema or atrophy.

Soft tissue
There is a large, multilobulated mass along the ulnar aspect of the
third proximal phalanx propagating proximally within the lumbrical
muscle to the mid volar palm. The mass involves the fourth flexor
tendon sheaths in the mid palm (series 10, image 35). The mass also
involves the dorsal webspace between the third and fourth fingers
and extends over the dorsal third proximal phalanx, under the
extensor tendon/extensor hood to the radial aspect of the proximal
phalanx. The mass does not appear to involve the third MCP joint.

The lesion is difficult to measure, but maximally measures 2.3 cm in
AP dimension, 2.2 cm in TR dimension, and 8.1 cm in CC dimension.
The lesion demonstrates heterogeneous T2 signal with areas of T2
hypointensity, T1 iso- to hyperintensity, and relatively thick,
nodular peripheral and internal enhancement. There is no surrounding
muscle edema or bony invasion. The mass extends to the dorsal skin
surface in the third webspace.
IMPRESSION: 1. Large, multilobulated, infiltrative solid mass along the proximal
middle finger, third webspace, and volar palm as described above,
most suggestive of epithelioid sarcoma given the appearance,
location, and patient's age. Tissue sampling and/or surgical
excision is recommended.

## 2019-12-24 ENCOUNTER — Other Ambulatory Visit: Payer: Medicaid Other

## 2019-12-24 ENCOUNTER — Other Ambulatory Visit: Payer: Self-pay

## 2019-12-24 DIAGNOSIS — Z20822 Contact with and (suspected) exposure to covid-19: Secondary | ICD-10-CM

## 2019-12-26 LAB — NOVEL CORONAVIRUS, NAA: SARS-CoV-2, NAA: NOT DETECTED

## 2019-12-26 LAB — SARS-COV-2, NAA 2 DAY TAT

## 2020-08-21 ENCOUNTER — Ambulatory Visit: Payer: Medicaid Other | Attending: Internal Medicine

## 2020-08-21 DIAGNOSIS — Z20822 Contact with and (suspected) exposure to covid-19: Secondary | ICD-10-CM

## 2020-08-22 LAB — SARS-COV-2, NAA 2 DAY TAT

## 2020-08-22 LAB — NOVEL CORONAVIRUS, NAA: SARS-CoV-2, NAA: NOT DETECTED

## 2020-09-22 ENCOUNTER — Ambulatory Visit (INDEPENDENT_AMBULATORY_CARE_PROVIDER_SITE_OTHER): Payer: No Payment, Other | Admitting: Licensed Clinical Social Worker

## 2020-09-22 ENCOUNTER — Other Ambulatory Visit: Payer: Self-pay

## 2020-09-22 ENCOUNTER — Encounter (HOSPITAL_COMMUNITY): Payer: Self-pay | Admitting: Licensed Clinical Social Worker

## 2020-09-22 DIAGNOSIS — F411 Generalized anxiety disorder: Secondary | ICD-10-CM | POA: Insufficient documentation

## 2020-09-22 DIAGNOSIS — F320 Major depressive disorder, single episode, mild: Secondary | ICD-10-CM | POA: Insufficient documentation
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
## 2020-09-28 ENCOUNTER — Encounter (HOSPITAL_COMMUNITY): Payer: Self-pay

## 2020-09-28 ENCOUNTER — Ambulatory Visit (INDEPENDENT_AMBULATORY_CARE_PROVIDER_SITE_OTHER): Payer: No Payment, Other | Admitting: Psychiatry

## 2020-09-28 ENCOUNTER — Other Ambulatory Visit: Payer: Self-pay

## 2020-09-28 VITALS — BP 110/70 | HR 68 | Ht 67.5 in | Wt 140.0 lb

## 2020-09-28 DIAGNOSIS — F329 Major depressive disorder, single episode, unspecified: Secondary | ICD-10-CM | POA: Diagnosis not present

## 2020-09-28 DIAGNOSIS — F411 Generalized anxiety disorder: Secondary | ICD-10-CM

## 2020-09-28 DIAGNOSIS — F320 Major depressive disorder, single episode, mild: Secondary | ICD-10-CM

## 2020-09-28 MED ORDER — GABAPENTIN 100 MG PO CAPS: 100.0000 mg | ORAL_CAPSULE | Freq: Three times a day (TID) | ORAL | 2 refills | Status: DC

## 2020-09-28 MED ORDER — FLUOXETINE HCL 20 MG PO CAPS: 20.0000 mg | ORAL_CAPSULE | Freq: Every day | ORAL | 2 refills | Status: DC

## 2020-09-28 NOTE — Progress Notes (Signed)
Psychiatric Initial Adult Assessment   Patient Identification: Curtis Howard MRN:  [id number] Date of Evaluation:  09/28/2020 Referral Source: self Chief Complaint:   Chief Complaint   Anxiety; Establish Care; Depression    Visit Diagnosis: General anxiety d/o, MDD  History of Present Illness:  30 yo male presents with anxiety and depression.  His anxiety is "lingering", not overwhelming, no panic attacks.  His depression is a 3-4/10 with no suicidal ideations, past attempts, or hospitalizations.  Low motivation at times and some difficulty with concentrating.  Past history of ADHD and treated from childhood through his teens.  He does not want any "addictive" medication.  No history of trauma.  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  No homicidal ideations, paranoia, hallucinations, mania, or substance use.  He stopped vaping nicotine a couple of weeks ago, using nicotine pouches with the goal to stop.  [name] is connecting with therapy and interested in restarting Prozac that was helpful for him, stopped in January as he was feeling better.  Sleep and appetite are "good", some weight loss from working outside in the heat.  Decided to restart Prozac for depression and gabapentin for anxiety.  Follow up in 2 weeks to monitor side effects and mood.    Associated Signs/Symptoms: Depression Symptoms:  depressed mood, difficulty concentrating, low motivation at times (Hypo) Manic Symptoms:   none Anxiety Symptoms:  Excessive Worry, Psychotic Symptoms:   none PTSD Symptoms: Negative  Past Psychiatric History: depression, anxiety, ADHD  Previous Psychotropic Medications: Yes   Substance Abuse History in the last 12 months:  No.  Consequences of Substance Abuse: NA  Past Medical History:  Past Medical History:  Diagnosis Date   Acne    ADHD (attention deficit hyperactivity disorder)    Allergy    Anxiety    Asthma     Dysthymic disorder    Pneumonia    h/o   Reactive airway disease    Tobacco use disorder     Past Surgical History:  Procedure Laterality Date   WISDOM TOOTH EXTRACTION  2012    Family Psychiatric History: none  Family History:  Family History  Problem Relation Age of Onset   Diabetes Mother     Social History:   Social History   Socioeconomic History   Marital status: Single    Spouse name: Not on file   Number of children: Not on file   Years of education: Not on file   Highest education level: Not on file  Occupational History   Not on file  Tobacco Use   Smoking status: Former    Pack years: 0.00    Types: Cigarettes    Quit date: 03/12/2018    Years since quitting: 2.5   Smokeless tobacco: Current    Types: Chew  Substance and Sexual Activity   Alcohol use: No   Drug use: No   Sexual activity: Yes    Partners: Female  Other Topics Concern   Not on file  Social History Narrative   Not on file   Social Determinants of Health   Financial Resource Strain: Low Risk    Difficulty of Paying Living Expenses: Not hard at all  Food Insecurity: No Food Insecurity   Worried About Programme researcher, broadcasting/film/video in the Last Year: Never true   Ran Out of Food in the Last Year: Never true  Transportation Needs: No Transportation Needs   Lack of  Transportation (Medical): No   Lack of Transportation (Non-Medical): No  Physical Activity: Sufficiently Active   Days of Exercise per Week: 4 days   Minutes of Exercise per Session: 60 min  Stress: Stress Concern Present   Feeling of Stress : Rather much  Social Connections: Moderately Isolated   Frequency of Communication with Friends and Family: Three times a week   Frequency of Social Gatherings with Friends and Family: Twice a week   Attends Religious Services: 1 to 4 times per year   Active Member of [employer] or Organizations: No   Attends Banker Meetings: Never   Marital Status: Never married    Additional  Social History: works independently as a Sales executive, seeking more stable work.  Allergies:  No Known Allergies  Metabolic Disorder Labs: No results found for: HGBA1C, MPG No results found for: PROLACTIN No results found for: CHOL, TRIG, HDL, CHOLHDL, VLDL, LDLCALC No results found for: TSH  Therapeutic Level Labs: No results found for: LITHIUM No results found for: CBMZ No results found for: VALPROATE  Current Medications: Current Outpatient Medications  Medication Sig Dispense Refill   FLUoxetine (PROZAC) 20 MG capsule Take 1 capsule (20 mg total) by mouth daily. 30 capsule 2   gabapentin (NEURONTIN) 100 MG capsule Take 1 capsule (100 mg total) by mouth 3 (three) times daily. 90 capsule 2   omeprazole (PRILOSEC) 40 MG capsule Take 1 capsule (40 mg total) by mouth daily. 30 capsule 0   ondansetron (ZOFRAN) 4 MG tablet Take 1 tablet (4 mg total) by mouth every 8 (eight) hours as needed for nausea or vomiting. 20 tablet 1   traZODone (DESYREL) 50 MG tablet 1/2 tablet po QHS 30 tablet 3   No current facility-administered medications for this visit.    Musculoskeletal: Strength & Muscle Tone: within normal limits Gait & Station: normal Patient leans: N/A  Psychiatric Specialty Exam: Review of Systems  Psychiatric/Behavioral:  Positive for dysphoric mood. The patient is nervous/anxious.    There were no vitals taken for this visit.There is no height or weight on file to calculate BMI.  General Appearance: Casual  Eye Contact:  Good  Speech:  Normal Rate  Volume:  Normal  Mood:  Anxious and Depressed  Affect:  Congruent  Thought Process:  Coherent and Descriptions of Associations: Intact  Orientation:  Full (Time, Place, and Person)  Thought Content:  WDL and Logical  Suicidal Thoughts:  No  Homicidal Thoughts:  No  Memory:  Immediate;   Good Recent;   Good Remote;   Good  Judgement:  Good  Insight:  Good  Psychomotor Activity:  Normal  Concentration:  Concentration:  Good and Attention Span: Good  Recall:  Good  Fund of Knowledge:Good  Language: Good  Akathisia:  No  Handed:  Right  AIMS (if indicated):  not done  Assets:  Housing Leisure Time Physical Health Resilience Social Support  ADL's:  Intact  Cognition: WNL  Sleep:  Good   Screenings: GAD-7    Advertising copywriter from 09/22/2020 in [facility]  Total GAD-7 Score 11      PHQ2-9    Flowsheet Row Office Visit from 09/28/2020 in [facility] Counselor from 09/22/2020 in [facility]  PHQ-2 Total Score 1 1      Flowsheet Row Office Visit from 09/28/2020 in [facility] Counselor from 09/22/2020 in [facility]  C-SSRS RISK CATEGORY  No Risk No Risk       Assessment and Plan:  Major depressive disorder, recurrent, mild: -Start Prozac 20 mg daily  General anxiety disorder: -Start gabapentin 100 mg TID PRN  Virtual Visit via Video Note  I connected with Antonieta Pert on 09/28/20 at 11:00 AM EDT by a video enabled telemedicine application and verified that I am speaking with the correct person using two identifiers.  Location: Patient: home Provider: home office   I discussed the limitations of evaluation and management by telemedicine and the availability of in person appointments. The patient expressed understanding and agreed to proceed.  Follow Up Instructions: Follow up in 2 weeks   I discussed the assessment and treatment plan with the patient. The patient was provided an opportunity to ask questions and all were answered. The patient agreed with the plan and demonstrated an understanding of the instructions.   The patient was advised to call back or seek an in-person evaluation if the symptoms worsen or if the condition fails to improve as anticipated.  I provided 45 minutes of non-face-to-face time during this  encounter.   Nanine Means, NP    Nanine Means, NP 6/20/202211:38 AM

## 2020-11-09 ENCOUNTER — Encounter (HOSPITAL_COMMUNITY): Payer: Self-pay

## 2020-11-09 ENCOUNTER — Telehealth (INDEPENDENT_AMBULATORY_CARE_PROVIDER_SITE_OTHER): Payer: No Payment, Other | Admitting: Psychiatry

## 2020-11-09 DIAGNOSIS — F320 Major depressive disorder, single episode, mild: Secondary | ICD-10-CM

## 2020-11-09 DIAGNOSIS — F411 Generalized anxiety disorder: Secondary | ICD-10-CM | POA: Diagnosis not present

## 2020-11-09 MED ORDER — GABAPENTIN 100 MG PO CAPS: 100.0000 mg | ORAL_CAPSULE | Freq: Three times a day (TID) | ORAL | 2 refills | Status: DC

## 2020-11-09 MED ORDER — FLUOXETINE HCL 20 MG PO CAPS: 20.0000 mg | ORAL_CAPSULE | Freq: Every day | ORAL | 2 refills | Status: DC

## 2020-11-09 NOTE — Progress Notes (Signed)
BH NP OP Progress Note  Patient Identification: Curtis Howard MRN:  [id number] Date of Evaluation:  11/09/2020 Referral Source: self Chief Complaint:   Chief Complaint   Anxiety; Medication Refill; Depression    Visit Diagnosis: General anxiety d/o, MDD  History of Present Illness:  30 yo male presents with anxiety and depression.  His anxiety is "lingering", not overwhelming, no panic attacks.  His depression is a 3-4/10 with no suicidal ideations, past attempts, or hospitalizations.  Low motivation at times and some difficulty with concentrating.  Past history of ADHD and treated from childhood through his teens.  He does not want any "addictive" medication.  No history of trauma.  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  No homicidal ideations, paranoia, hallucinations, mania, or substance use.  He stopped vaping nicotine a couple of weeks ago, using nicotine pouches with the goal to stop.  [name] is connecting with therapy and interested in restarting Prozac that was helpful for him, stopped in January as he was feeling better.  Sleep and appetite are "good", some weight loss from working outside in the heat.  Decided to restart Prozac for depression and gabapentin for anxiety.  Follow up in 2 weeks to monitor side effects and mood.    Associated Signs/Symptoms: Depression Symptoms:  depressed mood, difficulty concentrating, low motivation at times (Hypo) Manic Symptoms:   none Anxiety Symptoms:  Excessive Worry, Psychotic Symptoms:   none PTSD Symptoms: Negative  Past Psychiatric History: depression, anxiety, ADHD  Previous Psychotropic Medications: Yes   Substance Abuse History in the last 12 months:  No.  Consequences of Substance Abuse: NA  Past Medical History:  Past Medical History:  Diagnosis Date   Acne    ADHD (attention deficit hyperactivity disorder)    Allergy    Anxiety    Asthma    Dysthymic disorder     Pneumonia    h/o   Reactive airway disease    Tobacco use disorder     Past Surgical History:  Procedure Laterality Date   WISDOM TOOTH EXTRACTION  2012    Family Psychiatric History: none  Family History:  Family History  Problem Relation Age of Onset   Diabetes Mother     Social History:   Social History   Socioeconomic History   Marital status: Single    Spouse name: Not on file   Number of children: Not on file   Years of education: Not on file   Highest education level: Not on file  Occupational History   Not on file  Tobacco Use   Smoking status: Former    Types: Cigarettes    Quit date: 03/12/2018    Years since quitting: 2.6   Smokeless tobacco: Current    Types: Chew  Substance and Sexual Activity   Alcohol use: No   Drug use: No   Sexual activity: Yes    Partners: Female  Other Topics Concern   Not on file  Social History Narrative   Not on file   Social Determinants of Health   Financial Resource Strain: Low Risk    Difficulty of Paying Living Expenses: Not hard at all  Food Insecurity: No Food Insecurity   Worried About Programme researcher, broadcasting/film/video in the Last Year: Never true   Ran Out of Food in the Last Year: Never true  Transportation Needs: No Transportation Needs   Lack of Transportation (Medical): No   Lack  of Transportation (Non-Medical): No  Physical Activity: Sufficiently Active   Days of Exercise per Week: 4 days   Minutes of Exercise per Session: 60 min  Stress: Stress Concern Present   Feeling of Stress : Rather much  Social Connections: Moderately Isolated   Frequency of Communication with Friends and Family: Three times a week   Frequency of Social Gatherings with Friends and Family: Twice a week   Attends Religious Services: 1 to 4 times per year   Active Member of [employer] or Organizations: No   Attends Banker Meetings: Never   Marital Status: Never married    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  Allergies:  No Known Allergies  Metabolic Disorder Labs: No results found for: HGBA1C, MPG No results found for: PROLACTIN No results found for: CHOL, TRIG, HDL, CHOLHDL, VLDL, LDLCALC No results found for: TSH  Therapeutic Level Labs: No results found for: LITHIUM No results found for: CBMZ No results found for: VALPROATE  Current Medications: Current Outpatient Medications  Medication Sig Dispense Refill   FLUoxetine (PROZAC) 20 MG capsule Take 1 capsule (20 mg total) by mouth daily. 30 capsule 2   gabapentin (NEURONTIN) 100 MG capsule Take 1 capsule (100 mg total) by mouth 3 (three) times daily. 90 capsule 2   omeprazole (PRILOSEC) 40 MG capsule Take 1 capsule (40 mg total) by mouth daily. 30 capsule 0   ondansetron (ZOFRAN) 4 MG tablet Take 1 tablet (4 mg total) by mouth every 8 (eight) hours as needed for nausea or vomiting. 20 tablet 1   traZODone (DESYREL) 50 MG tablet 1/2 tablet po QHS 30 tablet 3   No current facility-administered medications for this visit.    Musculoskeletal: Strength & Muscle Tone: within normal limits Gait & Station: normal Patient leans: N/A  Psychiatric Specialty Exam: Review of Systems  Psychiatric/Behavioral:  Positive for dysphoric mood. The patient is nervous/anxious.    There were no vitals taken for this visit.There is no height or weight on file to calculate BMI.  General Appearance: Casual  Eye Contact:  Good  Speech:  Normal Rate  Volume:  Normal  Mood:  Anxious and Depressed  Affect:  Congruent  Thought Process:  Coherent and Descriptions of Associations: Intact  Orientation:  Full (Time, Place, and Person)  Thought Content:  WDL and Logical  Suicidal Thoughts:  No  Homicidal Thoughts:  No  Memory:  Immediate;   Good Recent;   Good Remote;   Good  Judgement:  Good  Insight:  Good  Psychomotor Activity:  Normal  Concentration:  Concentration: Good and Attention Span: Good  Recall:  Good   Fund of Knowledge:Good  Language: Good  Akathisia:  No  Handed:  Right  AIMS (if indicated):  not done  Assets:  Housing Leisure Time Physical Health Resilience Social Support  ADL's:  Intact  Cognition: WNL  Sleep:  Good   Screenings: GAD-7    Advertising copywriter from 09/22/2020 in [facility]  Total GAD-7 Score 11      PHQ2-9    Flowsheet Row Office Visit from 09/28/2020 in [facility] Counselor from 09/22/2020 in [facility]  PHQ-2 Total Score 1 1      Flowsheet Row Office Visit from 09/28/2020 in [facility] Counselor from 09/22/2020 in [facility]  C-SSRS RISK CATEGORY No Risk No Risk  Assessment and Plan:  Major depressive disorder, recurrent, mild: -Start Prozac 20 mg daily  General anxiety disorder: -Start gabapentin 100 mg TID PRN  Virtual Visit via Video Note  I connected with Antonieta Pert on 11/09/20 at  8:30 AM EDT by a video enabled telemedicine application and verified that I am speaking with the correct person using two identifiers.  Location: Patient: home Provider: home office   I discussed the limitations of evaluation and management by telemedicine and the availability of in person appointments. The patient expressed understanding and agreed to proceed.  Follow Up Instructions: Follow up in 2 weeks   I discussed the assessment and treatment plan with the patient. The patient was provided an opportunity to ask questions and all were answered. The patient agreed with the plan and demonstrated an understanding of the instructions.   The patient was advised to call back or seek an in-person evaluation if the symptoms worsen or if the condition fails to improve as anticipated.  I provided 45 minutes of non-face-to-face time during this encounter.   Nanine Means, NP    Nanine Means,  NP 8/1/20228:16 AM

## 2020-11-09 NOTE — Progress Notes (Signed)
BH MD OP Progress Note  Patient Identification: Curtis Howard MRN:  [id number] Date of Evaluation:  11/09/2020 Referral Source: self Chief Complaint:   Chief Complaint   Anxiety; Medication Refill; Depression    Visit Diagnosis: General anxiety d/o, MDD  History of Present Illness:  30 yo male presents with anxiety and depression.  He feels his medications are "kicking in" with some sleep side effects. Reports sleeping 8 hours still but reports feeling "tired" for the past two weeks.  We discussed vitamin D and link to depression/fatigue. Anxiety is "much better", 3/10 with no panic attacks.  Depression is minimal.  No homicidal ideations, paranoia, hallucinations, mania, or substance use.  He has continued his cessation of vaping, positive reinforcement provided.  No weight gain which he has suffered in the past with antidepressants.  Overall, pleased with his medications and their positive effects.  Continue Prozac for depression and gabapentin for anxiety.  Follow up in 6 weeks to monitor side effects and mood.    Past Psychiatric History: depression, anxiety, ADHD  Previous Psychotropic Medications: Yes   Substance Abuse History in the last 12 months:  No.  Consequences of Substance Abuse: NA  Past Medical History:  Past Medical History:  Diagnosis Date   Acne    ADHD (attention deficit hyperactivity disorder)    Allergy    Anxiety    Asthma    Dysthymic disorder    Pneumonia    h/o   Reactive airway disease    Tobacco use disorder     Past Surgical History:  Procedure Laterality Date   WISDOM TOOTH EXTRACTION  2012    Family Psychiatric History: none  Family History:  Family History  Problem Relation Age of Onset   Diabetes Mother     Social History:   Social History   Socioeconomic History   Marital status: Single    Spouse name: Not on file   Number of children: Not on file   Years of education: Not on file   Highest education level: Not on file   Occupational History   Not on file  Tobacco Use   Smoking status: Former    Types: Cigarettes    Quit date: 03/12/2018    Years since quitting: 2.6   Smokeless tobacco: Current    Types: Chew  Substance and Sexual Activity   Alcohol use: No   Drug use: No   Sexual activity: Yes    Partners: Female  Other Topics Concern   Not on file  Social History Narrative   Not on file   Social Determinants of Health   Financial Resource Strain: Low Risk    Difficulty of Paying Living Expenses: Not hard at all  Food Insecurity: No Food Insecurity   Worried About Programme researcher, broadcasting/film/video in the Last Year: Never true   Ran Out of Food in the Last Year: Never true  Transportation Needs: No Transportation Needs   Lack of Transportation (Medical): No   Lack of Transportation (Non-Medical): No  Physical Activity: Sufficiently Active   Days of Exercise per Week: 4 days   Minutes of Exercise per Session: 60 min  Stress: Stress Concern Present   Feeling of Stress : Rather much  Social Connections: Moderately Isolated   Frequency of Communication with Friends and Family: Three times a week   Frequency of Social Gatherings with Friends and Family: Twice a week   Attends Religious Services: 1 to 4 times per year   Active Member  of Clubs or Organizations: No   Attends Banker Meetings: Never   Marital Status: Never married    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  Allergies:  No Known Allergies  Metabolic Disorder Labs: No results found for: HGBA1C, MPG No results found for: PROLACTIN No results found for: CHOL, TRIG, HDL, CHOLHDL, VLDL, LDLCALC No results found for: TSH  Therapeutic Level Labs: No results found for: LITHIUM No results found for: CBMZ No results found for: VALPROATE  Current Medications: Current Outpatient Medications  Medication Sig Dispense Refill   FLUoxetine (PROZAC) 20 MG capsule Take 1 capsule (20  mg total) by mouth daily. 30 capsule 2   gabapentin (NEURONTIN) 100 MG capsule Take 1 capsule (100 mg total) by mouth 3 (three) times daily. 90 capsule 2   omeprazole (PRILOSEC) 40 MG capsule Take 1 capsule (40 mg total) by mouth daily. 30 capsule 0   ondansetron (ZOFRAN) 4 MG tablet Take 1 tablet (4 mg total) by mouth every 8 (eight) hours as needed for nausea or vomiting. 20 tablet 1   traZODone (DESYREL) 50 MG tablet 1/2 tablet po QHS 30 tablet 3   No current facility-administered medications for this visit.    Musculoskeletal: Strength & Muscle Tone: within normal limits Gait & Station: normal Patient leans: N/A  Psychiatric Specialty Exam: Review of Systems  Psychiatric/Behavioral:  Positive for dysphoric mood. The patient is nervous/anxious.    There were no vitals taken for this visit.There is no height or weight on file to calculate BMI.  General Appearance: Casual  Eye Contact:  Good  Speech:  Normal Rate  Volume:  Normal  Mood:  Anxious and Depressed  Affect:  Congruent  Thought Process:  Coherent and Descriptions of Associations: Intact  Orientation:  Full (Time, Place, and Person)  Thought Content:  WDL and Logical  Suicidal Thoughts:  No  Homicidal Thoughts:  No  Memory:  Immediate;   Good Recent;   Good Remote;   Good  Judgement:  Good  Insight:  Good  Psychomotor Activity:  Normal  Concentration:  Concentration: Good and Attention Span: Good  Recall:  Good  Fund of Knowledge:Good  Language: Good  Akathisia:  No  Handed:  Right  AIMS (if indicated):  not done  Assets:  Housing Leisure Time Physical Health Resilience Social Support  ADL's:  Intact  Cognition: WNL  Sleep:  Good   Screenings: GAD-7    Advertising copywriter from 09/22/2020 in [facility]  Total GAD-7 Score 11      PHQ2-9    Flowsheet Row Office Visit from 09/28/2020 in [facility] Counselor from 09/22/2020 in [facility]  PHQ-2 Total Score 1 1      Flowsheet Row Office Visit from 09/28/2020 in [facility] Counselor from 09/22/2020 in [facility]  C-SSRS RISK CATEGORY No Risk No Risk       Assessment and Plan:  Major depressive disorder, recurrent, mild: -Continue Prozac 20 mg daily  General anxiety disorder: -Continue gabapentin 100 mg TID PRN  Virtual Visit via Video Note  I connected with Antonieta Pert on 11/09/20 at  8:30 AM EDT by a video enabled telemedicine application and verified that I am speaking with the correct person using two identifiers.  Location: Patient: home Provider: home office   I discussed the limitations of evaluation and management by telemedicine  and the availability of in person appointments. The patient expressed understanding and agreed to proceed.  Follow Up Instructions: Follow up in 6 weeks   I discussed the assessment and treatment plan with the patient. The patient was provided an opportunity to ask questions and all were answered. The patient agreed with the plan and demonstrated an understanding of the instructions.   The patient was advised to call back or seek an in-person evaluation if the symptoms worsen or if the condition fails to improve as anticipated.  I provided 20 minutes of non-face-to-face time during this encounter.   Nanine Means, NP    Nanine Means, NP 8/1/20228:14 AM

## 2020-12-01 ENCOUNTER — Encounter (HOSPITAL_COMMUNITY): Payer: Self-pay

## 2020-12-08 ENCOUNTER — Other Ambulatory Visit (HOSPITAL_COMMUNITY): Payer: Self-pay | Admitting: Psychiatry

## 2020-12-21 ENCOUNTER — Telehealth (HOSPITAL_COMMUNITY): Payer: No Payment, Other

## 2020-12-28 ENCOUNTER — Telehealth (INDEPENDENT_AMBULATORY_CARE_PROVIDER_SITE_OTHER): Payer: No Payment, Other | Admitting: Psychiatry

## 2020-12-28 ENCOUNTER — Encounter (HOSPITAL_COMMUNITY): Payer: Self-pay

## 2020-12-28 DIAGNOSIS — F411 Generalized anxiety disorder: Secondary | ICD-10-CM | POA: Diagnosis not present

## 2020-12-28 MED ORDER — FLUOXETINE HCL 20 MG PO CAPS: 20.0000 mg | ORAL_CAPSULE | Freq: Every day | ORAL | 3 refills | Status: DC

## 2020-12-28 NOTE — Progress Notes (Signed)
BH NP OP Progress Note  Patient Identification: Curtis Howard MRN:  [id number] Date of Evaluation:  12/28/2020 Referral Source: self Chief Complaint:   Chief Complaint   Anxiety; Follow-up; Depression    Visit Diagnosis: General anxiety d/o  History of Present Illness:  30 yo male presents with anxiety and depression follow up after starting Prozac daily and gabapentin PRN.  He reports, "I'm better than I have been in a long time."  Depression rating of below mil and manageable level of anxiety.  "My anxiety has gone down tremendously."  His sleep is "good" and appetite has increased which he relates to feeling better.  He did go from the 130s to 150 pound range and will continue to monitor.  Denies side effects of medications.  No suicidal ideations, past attempts, or hospitalizations.  No mania, psychosis, or substance abuse.  He does not want any "addictive" medication. Continue Prozac for depression and gabapentin for anxiety and depression.  Follow up in 3 months to monitor side effects and mood.    Past Psychiatric History: depression, anxiety, ADHD  Substance Abuse History in the last 12 months:  No.  Consequences of Substance Abuse: NA  Past Medical History:  Past Medical History:  Diagnosis Date   Acne    ADHD (attention deficit hyperactivity disorder)    Allergy    Anxiety    Asthma    Dysthymic disorder    Pneumonia    h/o   Reactive airway disease    Tobacco use disorder     Past Surgical History:  Procedure Laterality Date   WISDOM TOOTH EXTRACTION  2012    Family Psychiatric History: none  Family History:  Family History  Problem Relation Age of Onset   Diabetes Mother     Social History:   Social History   Socioeconomic History   Marital status: Single    Spouse name: Not on file   Number of children: Not on file   Years of education: Not on file   Highest education level: Not on file  Occupational History   Not on file  Tobacco Use    Smoking status: Former    Types: Cigarettes    Quit date: 03/12/2018    Years since quitting: 2.8   Smokeless tobacco: Current    Types: Chew  Substance and Sexual Activity   Alcohol use: No   Drug use: No   Sexual activity: Yes    Partners: Female  Other Topics Concern   Not on file  Social History Narrative   Not on file   Social Determinants of Health   Financial Resource Strain: Low Risk    Difficulty of Paying Living Expenses: Not hard at all  Food Insecurity: No Food Insecurity   Worried About Programme researcher, broadcasting/film/video in the Last Year: Never true   Ran Out of Food in the Last Year: Never true  Transportation Needs: No Transportation Needs   Lack of Transportation (Medical): No   Lack of Transportation (Non-Medical): No  Physical Activity: Sufficiently Active   Days of Exercise per Week: 4 days   Minutes of Exercise per Session: 60 min  Stress: Stress Concern Present   Feeling of Stress : Rather much  Social Connections: Moderately Isolated   Frequency of Communication with Friends and Family: Three times a week   Frequency of Social Gatherings with Friends and Family: Twice a week   Attends Religious Services: 1 to 4 times per year   Active Member  of Clubs or Organizations: No   Attends Banker Meetings: Never   Marital Status: Never married    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  Allergies:  No Known Allergies  Metabolic Disorder Labs: No results found for: HGBA1C, MPG No results found for: PROLACTIN No results found for: CHOL, TRIG, HDL, CHOLHDL, VLDL, LDLCALC No results found for: TSH  Therapeutic Level Labs: No results found for: LITHIUM No results found for: CBMZ No results found for: VALPROATE  Current Medications: Current Outpatient Medications  Medication Sig Dispense Refill   gabapentin (NEURONTIN) 100 MG capsule Take 1 capsule (100 mg total) by mouth 3 (three) times daily. 90  capsule 2   omeprazole (PRILOSEC) 40 MG capsule Take 1 capsule (40 mg total) by mouth daily. 30 capsule 0   ondansetron (ZOFRAN) 4 MG tablet Take 1 tablet (4 mg total) by mouth every 8 (eight) hours as needed for nausea or vomiting. 20 tablet 1   FLUoxetine (PROZAC) 20 MG capsule Take 1 capsule (20 mg total) by mouth daily. 30 capsule 3   No current facility-administered medications for this visit.    Musculoskeletal: Strength & Muscle Tone: within normal limits Gait & Station: normal Patient leans: N/A  Psychiatric Specialty Exam: Review of Systems  Psychiatric/Behavioral:  The patient is nervous/anxious.   All other systems reviewed and are negative.  There were no vitals taken for this visit.There is no height or weight on file to calculate BMI.  General Appearance: Casual  Eye Contact:  Good  Speech:  Normal Rate  Volume:  Normal  Mood:  Anxious  Affect:  Congruent  Thought Process:  Coherent and Descriptions of Associations: Intact  Orientation:  Full (Time, Place, and Person)  Thought Content:  WDL and Logical  Suicidal Thoughts:  No  Homicidal Thoughts:  No  Memory:  Immediate;   Good Recent;   Good Remote;   Good  Judgement:  Good  Insight:  Good  Psychomotor Activity:  Normal  Concentration:  Concentration: Good and Attention Span: Good  Recall:  Good  Fund of Knowledge:Good  Language: Good  Akathisia:  No  Handed:  Right  AIMS (if indicated):  not done  Assets:  Housing Leisure Time Physical Health Resilience Social Support  ADL's:  Intact  Cognition: WNL  Sleep:  Good   Screenings: GAD-7    Advertising copywriter from 09/22/2020 in [facility]  Total GAD-7 Score 11      PHQ2-9    Flowsheet Row Office Visit from 09/28/2020 in [facility] Counselor from 09/22/2020 in [facility]  PHQ-2 Total Score 1 1      Flowsheet Row Office Visit from 09/28/2020 in [facility] Counselor from 09/22/2020 in [facility]  C-SSRS RISK CATEGORY No Risk No Risk       Assessment and Plan:  Major depressive disorder, recurrent, mild: -Continue Prozac 20 mg daily  General anxiety disorder: -Continue gabapentin 100 mg TID PRN  Virtual Visit via Video Note  I connected with Curtis Howard on 12/28/20 at 10:30 AM EDT by a video enabled telemedicine application and verified that I am speaking with the correct person using two identifiers.  Location: Patient: home Provider: work office   I discussed the limitations of evaluation and management by telemedicine and the availability of in person appointments. The patient expressed understanding and agreed to  proceed.  Follow Up Instructions: Follow up in 3 months   I discussed the assessment and treatment plan with the patient. The patient was provided an opportunity to ask questions and all were answered. The patient agreed with the plan and demonstrated an understanding of the instructions.   The patient was advised to call back or seek an in-person evaluation if the symptoms worsen or if the condition fails to improve as anticipated.  I provided 20 minutes of non-face-to-face time during this encounter.   Nanine Means, NP    Nanine Means, NP 9/19/202210:47 AM

## 2021-03-26 ENCOUNTER — Encounter (HOSPITAL_COMMUNITY): Payer: Self-pay

## 2021-03-29 ENCOUNTER — Telehealth (HOSPITAL_COMMUNITY): Payer: No Payment, Other | Admitting: Psychiatry

## 2021-04-26 ENCOUNTER — Telehealth (HOSPITAL_COMMUNITY): Payer: No Payment, Other | Admitting: Psychiatry

## 2021-04-27 ENCOUNTER — Telehealth (INDEPENDENT_AMBULATORY_CARE_PROVIDER_SITE_OTHER): Payer: No Payment, Other | Admitting: Psychiatry

## 2021-04-27 ENCOUNTER — Encounter (HOSPITAL_COMMUNITY): Payer: Self-pay | Admitting: Psychiatry

## 2021-04-27 DIAGNOSIS — F330 Major depressive disorder, recurrent, mild: Secondary | ICD-10-CM

## 2021-04-27 DIAGNOSIS — F411 Generalized anxiety disorder: Secondary | ICD-10-CM | POA: Diagnosis not present

## 2021-04-27 MED ORDER — FLUOXETINE HCL 20 MG PO CAPS: 20.0000 mg | ORAL_CAPSULE | Freq: Every day | ORAL | 5 refills | Status: DC

## 2021-04-27 MED ORDER — GABAPENTIN 100 MG PO CAPS: 100.0000 mg | ORAL_CAPSULE | Freq: Three times a day (TID) | ORAL | 5 refills | Status: DC

## 2021-04-27 NOTE — Progress Notes (Signed)
BH NP OP Progress Note  Patient Identification: Curtis Howard MRN:  [id number] Date of Evaluation:  04/27/2021 Referral Source: self Chief Complaint:   Chief Complaint   Anxiety; Follow-up; Depression    Visit Diagnosis: General anxiety d/o  History of Present Illness:  31 yo male presents with anxiety and depression follow up after starting Prozac daily and gabapentin PRN.  "Overall I'm doing just fine."  Patient states that he "has been doing much better." He rates his depression as "super low" and states his anxiety "is much better." He credits his medication regimen as well as daily mediation and PRN Gabapentin for his improvement in symptoms (he states that he has "not been having to use the Gabapentin as much"). Per patient, he has not had any recent panic attacks since last visit. He says that he has not been experiencing any additional side effects from his current 20mg /day Prozac regimen, although he does state that he has "gained a little bit of weight". He is not concerned with this side effect at this time, but he is instructed to monitor and inform this provider if he experiences an exacerbation in symptoms. Patient verbalized understanding. Patient endorses adequate sleep but does state that his work hours are sometimes an obstacle. No suicidal/homicidal ideations, hallucinations, or substance abuse.  Past Psychiatric History: depression, anxiety, ADHD  Substance Abuse History in the last 12 months:  No.  Consequences of Substance Abuse: NA  Past Medical History:  Past Medical History:  Diagnosis Date   Acne    ADHD (attention deficit hyperactivity disorder)    Allergy    Anxiety    Asthma    Dysthymic disorder    Pneumonia    h/o   Reactive airway disease    Tobacco use disorder     Past Surgical History:  Procedure Laterality Date   WISDOM TOOTH EXTRACTION  2012    Family Psychiatric History: none  Family History:  Family History  Problem Relation Age  of Onset   Diabetes Mother     Social History:   Social History   Socioeconomic History   Marital status: Single    Spouse name: Not on file   Number of children: Not on file   Years of education: Not on file   Highest education level: Not on file  Occupational History   Not on file  Tobacco Use   Smoking status: Former    Types: Cigarettes    Quit date: 03/12/2018    Years since quitting: 3.1   Smokeless tobacco: Current    Types: Chew  Substance and Sexual Activity   Alcohol use: No   Drug use: No   Sexual activity: Yes    Partners: Female  Other Topics Concern   Not on file  Social History Narrative   Not on file   Social Determinants of Health   Financial Resource Strain: Low Risk    Difficulty of Paying Living Expenses: Not hard at all  Food Insecurity: No Food Insecurity   Worried About 14/05/2017 in the Last Year: Never true   Ran Out of Food in the Last Year: Never true  Transportation Needs: No Transportation Needs   Lack of Transportation (Medical): No   Lack of Transportation (Non-Medical): No  Physical Activity: Sufficiently Active   Days of Exercise per Week: 4 days   Minutes of Exercise per Session: 60 min  Stress: Stress Concern Present   Feeling of Stress : Rather much  Social  Connections: Moderately Isolated   Frequency of Communication with Friends and Family: Three times a week   Frequency of Social Gatherings with Friends and Family: Twice a week   Attends Religious Services: 1 to 4 times per year   Active Member of [employer] or Organizations: No   Attends Banker Meetings: Never   Marital Status: Never married    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  Allergies:  No Known Allergies  Metabolic Disorder Labs: No results found for: HGBA1C, MPG No results found for: PROLACTIN No results found for: CHOL, TRIG, HDL, CHOLHDL, VLDL, LDLCALC No results found for:  TSH  Therapeutic Level Labs: No results found for: LITHIUM No results found for: CBMZ No results found for: VALPROATE  Current Medications: Current Outpatient Medications  Medication Sig Dispense Refill   FLUoxetine (PROZAC) 20 MG capsule Take 1 capsule (20 mg total) by mouth daily. 30 capsule 5   gabapentin (NEURONTIN) 100 MG capsule Take 1 capsule (100 mg total) by mouth 3 (three) times daily. 90 capsule 5   omeprazole (PRILOSEC) 40 MG capsule Take 1 capsule (40 mg total) by mouth daily. 30 capsule 0   ondansetron (ZOFRAN) 4 MG tablet Take 1 tablet (4 mg total) by mouth every 8 (eight) hours as needed for nausea or vomiting. 20 tablet 1   No current facility-administered medications for this visit.    Musculoskeletal: Strength & Muscle Tone: within normal limits Gait & Station: normal Patient leans: N/A  Psychiatric Specialty Exam: Review of Systems  Psychiatric/Behavioral:  Positive for dysphoric mood. The patient is nervous/anxious.   All other systems reviewed and are negative.  There were no vitals taken for this visit.There is no height or weight on file to calculate BMI.  General Appearance: Casual  Eye Contact:  Good  Speech:  Normal Rate  Volume:  Normal  Mood:  Anxious  Affect:  Congruent  Thought Process:  Coherent and Descriptions of Associations: Intact  Orientation:  Full (Time, Place, and Person)  Thought Content:  WDL and Logical  Suicidal Thoughts:  No  Homicidal Thoughts:  No  Memory:  Immediate;   Good Recent;   Good Remote;   Good  Judgement:  Good  Insight:  Good  Psychomotor Activity:  Normal  Concentration:  Concentration: Good and Attention Span: Good  Recall:  Good  Fund of Knowledge:Good  Language: Good  Akathisia:  No  Handed:  Right  AIMS (if indicated):  not done  Assets:  Housing Leisure Time Physical Health Resilience Social Support  ADL's:  Intact  Cognition: WNL  Sleep:  Good   Screenings: GAD-7    Occupational psychologist from 09/22/2020 in [facility]  Total GAD-7 Score 11      PHQ2-9    Flowsheet Row Office Visit from 09/28/2020 in [facility] Counselor from 09/22/2020 in [facility]  PHQ-2 Total Score 1 1      Flowsheet Row Office Visit from 09/28/2020 in [facility] Counselor from 09/22/2020 in [facility]  C-SSRS RISK CATEGORY No Risk No Risk       Assessment and Plan:  Major depressive disorder, recurrent, mild: -Continue Prozac 20 mg daily  General anxiety disorder: -Continue gabapentin 100 mg TID PRN  Virtual Visit via Video Note  I connected with Curtis Howard on 04/27/21 at 10:00 AM EST by a video enabled  telemedicine application and verified that I am speaking with the correct person using two identifiers.  Location: Patient: home Provider: work office   I discussed the limitations of evaluation and management by telemedicine and the availability of in person appointments. The patient expressed understanding and agreed to proceed.  Follow Up Instructions: Follow up in 6 months   I discussed the assessment and treatment plan with the patient. The patient was provided an opportunity to ask questions and all were answered. The patient agreed with the plan and demonstrated an understanding of the instructions.   The patient was advised to call back or seek an in-person evaluation if the symptoms worsen or if the condition fails to improve as anticipated.  I provided 10 minutes of non-face-to-face time during this encounter.   Nanine MeansJamison Arriyah Madej, NP    Nanine MeansJamison Leeam Cedrone, NP 1/17/202310:07 AM

## 2021-07-07 ENCOUNTER — Ambulatory Visit: Payer: BC Managed Care – PPO | Admitting: Family

## 2021-07-07 ENCOUNTER — Other Ambulatory Visit: Payer: Self-pay

## 2021-07-07 ENCOUNTER — Encounter: Payer: Self-pay | Admitting: Family

## 2021-07-07 VITALS — BP 124/76 | HR 89 | Temp 98.2°F | Resp 18 | Ht 69.0 in | Wt 170.0 lb

## 2021-07-07 DIAGNOSIS — L237 Allergic contact dermatitis due to plants, except food: Secondary | ICD-10-CM

## 2021-07-07 DIAGNOSIS — Z1159 Encounter for screening for other viral diseases: Secondary | ICD-10-CM

## 2021-07-07 DIAGNOSIS — F330 Major depressive disorder, recurrent, mild: Secondary | ICD-10-CM

## 2021-07-07 DIAGNOSIS — Z87891 Personal history of nicotine dependence: Secondary | ICD-10-CM

## 2021-07-07 DIAGNOSIS — M62830 Muscle spasm of back: Secondary | ICD-10-CM

## 2021-07-07 DIAGNOSIS — L509 Urticaria, unspecified: Secondary | ICD-10-CM

## 2021-07-07 DIAGNOSIS — F1991 Other psychoactive substance use, unspecified, in remission: Secondary | ICD-10-CM

## 2021-07-07 DIAGNOSIS — Z23 Encounter for immunization: Secondary | ICD-10-CM

## 2021-07-07 DIAGNOSIS — F411 Generalized anxiety disorder: Secondary | ICD-10-CM

## 2021-07-07 DIAGNOSIS — Z7289 Other problems related to lifestyle: Secondary | ICD-10-CM

## 2021-07-07 DIAGNOSIS — K219 Gastro-esophageal reflux disease without esophagitis: Secondary | ICD-10-CM

## 2021-07-07 DIAGNOSIS — Z7689 Persons encountering health services in other specified circumstances: Secondary | ICD-10-CM

## 2021-07-07 MED ORDER — PREDNISONE 20 MG PO TABS: ORAL_TABLET | ORAL | 0 refills | Status: AC

## 2021-07-07 MED ORDER — TETANUS-DIPHTH-ACELL PERTUSSIS 5-2.5-18.5 LF-MCG/0.5 IM SUSP: 0.5000 mL | Freq: Once | INTRAMUSCULAR | 0 refills | Status: AC

## 2021-07-07 MED ORDER — CYCLOBENZAPRINE HCL 10 MG PO TABS: 10.0000 mg | ORAL_TABLET | Freq: Three times a day (TID) | ORAL | 3 refills | Status: DC | PRN

## 2021-07-07 NOTE — Progress Notes (Signed)
? ?Provider: Marlowe Sax FNP-C  ? ?Tysinger, Camelia Eng, PA-C ? ?Patient Care Team: ?Tysinger, Leward Quan as PCP - General (Family Medicine) ? ?Extended Emergency Contact Information ?Primary Emergency Contact: Karolee Stamps W ?Address: [address] ?         [place], [place] ?Home Phone: [phone] ?Work Phone: [phone] ?Relation: Mother ?Secondary Emergency Contact: Tamayo,George ?Address: [address] ?         [place], [place] [address] [place] ?Home Phone: [phone] ?Work Phone: [phone] ?Relation: Father ? ?Code Status:  Full code  ?Goals of care: Advanced Directive information ?   ? View : No data to display.  ?  ?  ?  ? ? ? ?Chief Complaint  ?Patient presents with  ? Establish Care  ? posion ivy  ? HSV 2  ? Back Pain  ?  chronic  ? Rash  ? ? ?HPI:  ?Pt is a 31 y.o. male seen today establish care here at [facility] for medical management of chronic diseases.  Has medical history of chronic back pain from injury when he was 31 years old, depression and anxiety since 2020, GERD, muscle spasm, former cigarette smoking, illicit drug use in remission, vaping among others ?Sees a therapist once a week for depression and anxiety which has helped.  Currently on Prozac 20 mg daily.  States medication has made him gain weight but it works much better than any other antidepressant is taken in the past.  He does try to exercise as he was advised by the therapist to improve his mood. ? ?He quit smoking 4 years ago and had been smoking since he was 31 years old.  He continues to vape daily.  No lung issues. ? ?Also quit using illegal drugs several years ago. ?He drinks red bull every day.  But does not drink any alcohol. ?Working with PT OT but some legs due to past injuries. ? ?He complains of contact with poison ivy on Sunday 4 days ago while cleaning the yard.Has had rash on the forearms and both legs rash has been itchy and has noticed some  blisters.  He has applied cortisone and used oatmeal bath with some slight relief but rash continued to itch. ? ?Also complains of chronic itching on both arms without any rash.  States he wears gloves while at work. ?  ? ? ?Past Medical History:  ?Diagnosis Date  ? Acne   ? ADHD (attention deficit hyperactivity disorder)   ? Allergy   ? Anxiety   ? Asthma   ? Dysthymic disorder   ? Pneumonia   ? h/o  ? Reactive airway disease   ? Tobacco use disorder   ? ?Past Surgical History:  ?Procedure Laterality Date  ? [place] EXTRACTION  2012  ? ? ?No Known Allergies ? ?Allergies as of 07/07/2021   ?No Known Allergies ?  ? ?  ?Medication List  ?  ? ?  ? Accurate as of July 07, 2021 11:14 AM. If you have any questions, ask your nurse or doctor.  ?  ?  ? ?  ? ?cyclobenzaprine 10 MG tablet ?Commonly known as: FLEXERIL ?Take by mouth. ?  ?FLUoxetine 20 MG capsule ?Commonly known as: PROZAC ?Take 1 capsule (20 mg total) by mouth daily. ?  ?gabapentin 100 MG capsule ?Commonly known as: Neurontin ?Take 1 capsule (100 mg total) by mouth 3 (three) times daily. ?  ?omeprazole 40  MG capsule ?Commonly known as: PRILOSEC ?Take 1 capsule (40 mg total) by mouth daily. ?  ?ondansetron 4 MG tablet ?Commonly known as: Zofran ?Take 1 tablet (4 mg total) by mouth every 8 (eight) hours as needed for nausea or vomiting. ?  ? ?  ? ? ?Review of Systems  ?Constitutional:  Negative for appetite change, chills, fatigue, fever and unexpected weight change.  ?HENT:  Negative for congestion, dental problem, ear discharge, ear pain, facial swelling, hearing loss, nosebleeds, postnasal drip, rhinorrhea, sinus pressure, sinus pain, sneezing, sore throat, tinnitus and trouble swallowing.   ?Eyes:  Negative for pain, discharge, redness, itching and visual disturbance.  ?Respiratory:  Negative for cough, chest tightness, shortness of breath and wheezing.   ?Cardiovascular:  Negative for chest pain, palpitations and leg swelling.  ?Gastrointestinal:   Negative for abdominal distention, abdominal pain, blood in stool, constipation, diarrhea, nausea and vomiting.  ?Endocrine: Negative for cold intolerance, heat intolerance, polydipsia, polyphagia and polyuria.  ?Genitourinary:  Negative for difficulty urinating, dysuria, flank pain, frequency and urgency.  ?Musculoskeletal:  Negative for arthralgias, back pain, gait problem, joint swelling, myalgias, neck pain and neck stiffness.  ?Skin:  Positive for rash. Negative for color change, pallor and wound.  ?     Forearms and legs   ?Neurological:  Negative for dizziness, syncope, speech difficulty, weakness, light-headedness, numbness and headaches.  ?Hematological:  Does not bruise/bleed easily.  ?Psychiatric/Behavioral:  Negative for agitation, behavioral problems, confusion, hallucinations, self-injury, sleep disturbance and suicidal ideas. The patient is nervous/anxious.   ?     Depression and anxiety  ? ?Immunization History  ?Administered Date(s) Administered  ? DTaP 03/30/1992, 05/29/1992, 07/30/1992, 05/21/1993, 07/29/1997  ? HPV Quadrivalent 09/01/2010  ? Hepatitis A 02/23/2007, 05/01/2009  ? Hepatitis B 02/13/1992, 03/30/1992, 07/30/1992  ? HiB (PRP-OMP) 03/30/1992, 05/29/1992, 07/30/1992, 05/21/1993  ? IPV 03/30/1992, 05/29/1992, 07/30/1992, 07/29/1997  ? MMR 05/21/1993, 07/29/1997  ? Meningococcal Conjugate 09/01/2010  ? Meningococcal Polysaccharide 02/23/2007  ? PFIZER Comirnaty(Gray Top)Covid-19 Tri-Sucrose Vaccine 07/02/2019, 07/23/2019  ? Tdap 09/09/2005  ? ?Pertinent  Health Maintenance Due  ?Topic Date Due  ? INFLUENZA VACCINE  07/09/2021 (Originally 11/09/2020)  ? ? ?  07/07/2021  ? 10:37 AM  ?Fall Risk  ?Falls in the past year? 0  ?Was there an injury with Fall? 0  ?Fall Risk Category Calculator 0  ?Fall Risk Category Low  ?Patient Fall Risk Level Low fall risk  ? ?Functional Status Survey: ?  ? ?Vitals:  ? 07/07/21 1038  ?BP: 124/76  ?Pulse: 89  ?Resp: 18  ?Temp: 98.2 ?F (36.8 ?C)  ?Weight: 170 lb  (77.1 kg)  ?Height: $RemoveB'5\' 9"'HRpYBiaN$  (1.753 m)  ? ?Body mass index is 25.1 kg/m?Marland Kitchen ?Physical Exam ?Vitals reviewed.  ?Constitutional:   ?   General: He is not in acute distress. ?   Appearance: Normal appearance. He is normal weight. He is not ill-appearing or diaphoretic.  ?HENT:  ?   Head: Normocephalic.  ?   Right Ear: Tympanic membrane, ear canal and external ear normal. There is no impacted cerumen.  ?   Left Ear: Tympanic membrane, ear canal and external ear normal. There is no impacted cerumen.  ?   Nose: Nose normal. No congestion or rhinorrhea.  ?   Mouth/Throat:  ?   Mouth: Mucous membranes are moist.  ?   Pharynx: Oropharynx is clear. No oropharyngeal exudate or posterior oropharyngeal erythema.  ?Eyes:  ?   General: No scleral icterus.    ?   Right  eye: No discharge.     ?   Left eye: No discharge.  ?   Extraocular Movements: Extraocular movements intact.  ?   Conjunctiva/sclera: Conjunctivae normal.  ?   Pupils: Pupils are equal, round, and reactive to light.  ?Neck:  ?   Vascular: No carotid bruit.  ?Cardiovascular:  ?   Rate and Rhythm: Normal rate and regular rhythm.  ?   Pulses: Normal pulses.  ?   Heart sounds: Normal heart sounds. No murmur heard. ?  No friction rub. No gallop.  ?Pulmonary:  ?   Effort: Pulmonary effort is normal. No respiratory distress.  ?   Breath sounds: Normal breath sounds. No wheezing, rhonchi or rales.  ?Chest:  ?   Chest wall: No tenderness.  ?Abdominal:  ?   General: Bowel sounds are normal. There is no distension.  ?   Palpations: Abdomen is soft. There is no mass.  ?   Tenderness: There is no abdominal tenderness. There is no right CVA tenderness, left CVA tenderness, guarding or rebound.  ?Musculoskeletal:     ?   General: No swelling or tenderness. Normal range of motion.  ?   Cervical back: Normal range of motion. No rigidity or tenderness.  ?   Right lower leg: No edema.  ?   Left lower leg: No edema.  ?Lymphadenopathy:  ?   Cervical: No cervical adenopathy.  ?Skin: ?    General: Skin is warm and dry.  ?   Coloration: Skin is not pale.  ?   Findings: Rash present. No bruising, erythema or lesion.  ?   Comments: Multiple scattered areas on forearms and legs with small raised red wheal

## 2021-07-08 LAB — COMPLETE METABOLIC PANEL WITH GFR
AG Ratio: 1.9 (calc) (ref 1.0–2.5)
ALT: 26 U/L (ref 9–46)
AST: 27 U/L (ref 10–40)
Albumin: 4.7 g/dL (ref 3.6–5.1)
Alkaline phosphatase (APISO): 50 U/L (ref 36–130)
BUN: 15 mg/dL (ref 7–25)
CO2: 26 mmol/L (ref 20–32)
Calcium: 9.2 mg/dL (ref 8.6–10.3)
Chloride: 105 mmol/L (ref 98–110)
Creat: 0.87 mg/dL (ref 0.60–1.24)
Globulin: 2.5 g/dL (calc) (ref 1.9–3.7)
Glucose, Bld: 91 mg/dL (ref 65–99)
Potassium: 4.5 mmol/L (ref 3.5–5.3)
Sodium: 138 mmol/L (ref 135–146)
Total Bilirubin: 0.4 mg/dL (ref 0.2–1.2)
Total Protein: 7.2 g/dL (ref 6.1–8.1)
eGFR: 120 mL/min/{1.73_m2} (ref >=60)

## 2021-07-08 LAB — CBC WITH DIFFERENTIAL/PLATELET
Absolute Monocytes: 458 cells/uL (ref 200–950)
Basophils Absolute: 29 cells/uL (ref 0–200)
Basophils Relative: 0.5 %
Eosinophils Absolute: 162 cells/uL (ref 15–500)
Eosinophils Relative: 2.8 %
HCT: 44.6 % (ref 38.5–50.0)
Hemoglobin: 15 g/dL (ref 13.2–17.1)
Lymphs Abs: 1520 cells/uL (ref 850–3900)
MCH: 28.8 pg (ref 27.0–33.0)
MCHC: 33.6 g/dL (ref 32.0–36.0)
MCV: 85.8 fL (ref 80.0–100.0)
MPV: 9.6 fL (ref 7.5–12.5)
Monocytes Relative: 7.9 %
Neutro Abs: 3631 cells/uL (ref 1500–7800)
Neutrophils Relative %: 62.6 %
Platelets: 271 10*3/uL (ref 140–400)
RBC: 5.2 10*6/uL (ref 4.20–5.80)
RDW: 13.6 % (ref 11.0–15.0)
Total Lymphocyte: 26.2 %
WBC: 5.8 10*3/uL (ref 3.8–10.8)

## 2021-07-08 LAB — HEPATITIS C ANTIBODY
Hepatitis C Ab: NONREACTIVE
SIGNAL TO CUT-OFF: <0.02 (ref <1.00)

## 2021-07-08 LAB — ADVANCED WRITTEN NOTIFICATION (AWN) TEST REFUSAL: AWN TEST REFUSED: 7600

## 2021-07-08 LAB — TSH: TSH: 0.8 mIU/L (ref 0.40–4.50)

## 2021-08-04 ENCOUNTER — Encounter: Payer: Self-pay | Admitting: Family

## 2021-08-04 ENCOUNTER — Ambulatory Visit (INDEPENDENT_AMBULATORY_CARE_PROVIDER_SITE_OTHER): Payer: Self-pay | Admitting: Family

## 2021-08-04 VITALS — BP 120/80 | HR 87 | Temp 97.9°F | Resp 16 | Ht 69.0 in | Wt 176.4 lb

## 2021-08-04 DIAGNOSIS — Z113 Encounter for screening for infections with a predominantly sexual mode of transmission: Secondary | ICD-10-CM

## 2021-08-04 DIAGNOSIS — R300 Dysuria: Secondary | ICD-10-CM

## 2021-08-04 LAB — POCT URINALYSIS DIPSTICK
Bilirubin, UA: NEGATIVE
Glucose, UA: NEGATIVE
Ketones, UA: POSITIVE
Nitrite, UA: POSITIVE
Protein, UA: NEGATIVE
Spec Grav, UA: 1.01 (ref 1.010–1.025)
Urobilinogen, UA: NEGATIVE E.U./dL — AB
pH, UA: 6.5 (ref 5.0–8.0)

## 2021-08-04 NOTE — Progress Notes (Signed)
? ?Provider: Marlowe Sax FNP-C ? ?Emsley Custer, Nelda Bucks, NP ? ?Patient Care Team: ?Addalynn Kumari, Nelda Bucks, NP as PCP - General (Family Medicine) ? ?Extended Emergency Contact Information ?Primary Emergency Contact: Karolee Stamps W ?Address: [address] ?         [place], [place] ?Home Phone: [phone] ?Work Phone: [phone] ?Relation: Mother ?Secondary Emergency Contact: Chappelle,George ?Address: [address] ?         [place],  [address] [place] ?Home Phone: [phone] ?Work Phone: [phone] ?Relation: Father ? ?Code Status: Full Code  ?Goals of care: Advanced Directive information ? ?  08/04/2021  ?  2:42 PM  ?Advanced Directives  ?Does Patient Have a Medical Advance Directive? No  ?Would patient like information on creating a medical advance directive? No - Patient declined  ? ? ? ?Chief Complaint  ?Patient presents with  ? Acute Visit  ?  Patient request STD screening.   ? ? ?HPI:  ?Pt is a 31 y.o. male seen today for an acute visit for dysuria and screen for STD.Has had new sexual partner and not using any protection.He denies any discharge but states painful and burns with urination. Has chronic back pain since 31 yrs old. denies any fever,chills,nausea,vomiting,abdominal pain,flank pain,urgency,frequency,difficult urination or hematuria. ? ?Past Medical History:  ?Diagnosis Date  ? Acne   ? ADHD (attention deficit hyperactivity disorder)   ? Allergy   ? Anxiety   ? Asthma   ? Dysthymic disorder   ? Pneumonia   ? h/o  ? Reactive airway disease   ? Tobacco use disorder   ? ?Past Surgical History:  ?Procedure Laterality Date  ? [place] EXTRACTION  2012  ? ? ?No Known Allergies ? ?Outpatient Encounter Medications as of 08/04/2021  ?Medication Sig  ? cyclobenzaprine (FLEXERIL) 10 MG tablet Take 1 tablet (10 mg total) by mouth 3 (three) times daily as needed for muscle spasms.  ? FLUoxetine (PROZAC) 20 MG capsule Take 1 capsule (20 mg total) by mouth daily.  ?  gabapentin (NEURONTIN) 100 MG capsule Take 1 capsule (100 mg total) by mouth 3 (three) times daily.  ? omeprazole (PRILOSEC) 20 MG capsule Take 20 mg by mouth daily.  ? [DISCONTINUED] omeprazole (PRILOSEC) 40 MG capsule Take 1 capsule (40 mg total) by mouth daily.  ? ?No facility-administered encounter medications on file as of 08/04/2021.  ? ? ?Review of Systems  ?Constitutional:  Negative for appetite change, chills, fatigue and fever.  ?Gastrointestinal:  Negative for abdominal distention and abdominal pain.  ?Genitourinary:  Positive for dysuria. Negative for flank pain, frequency, hematuria, penile discharge and urgency.  ?Musculoskeletal:  Positive for back pain. Negative for arthralgias and myalgias.  ?Skin:  Negative for color change, pallor, rash and wound.  ?Neurological:  Negative for dizziness, light-headedness and headaches.  ? ?Immunization History  ?Administered Date(s) Administered  ? DTaP 03/30/1992, 05/29/1992, 07/30/1992, 05/21/1993, 07/29/1997  ? HPV Quadrivalent 09/01/2010  ? Hepatitis A 02/23/2007, 05/01/2009  ? Hepatitis B 02/13/1992, 03/30/1992, 07/30/1992  ? HiB (PRP-OMP) 03/30/1992, 05/29/1992, 07/30/1992, 05/21/1993  ? IPV 03/30/1992, 05/29/1992, 07/30/1992, 07/29/1997  ? MMR 05/21/1993, 07/29/1997  ? Meningococcal Conjugate 09/01/2010  ? Meningococcal Polysaccharide 02/23/2007  ? PFIZER Comirnaty(Gray Top)Covid-19 Tri-Sucrose Vaccine 07/02/2019, 07/23/2019  ? Tdap 09/09/2005  ? ?Pertinent  Health Maintenance Due  ?Topic Date Due  ? INFLUENZA VACCINE  11/09/2021  ? ? ?  07/07/2021  ? 10:37 AM 08/04/2021  ?  2:41 PM  ?Fall Risk  ?  Falls in the past year? 0 0  ?Was there an injury with Fall? 0 0  ?Fall Risk Category Calculator 0 0  ?Fall Risk Category Low Low  ?Patient Fall Risk Level Low fall risk Low fall risk  ?Patient at Risk for Falls Due to  No Fall Risks  ?Fall risk Follow up  Falls evaluation completed  ? ?Functional Status Survey: ?  ? ?Vitals:  ? 08/04/21 1436  ?BP: 120/80  ?Pulse: 87   ?Resp: 16  ?Temp: 97.9 ?F (36.6 ?C)  ?SpO2: 98%  ?Weight: 176 lb 6.4 oz (80 kg)  ?Height: 5' 9" (1.753 m)  ? ?Body mass index is 26.05 kg/m?Marland Kitchen ?Physical Exam ?Vitals reviewed. Exam conducted with a chaperone present [facility] [place],CMA).  ?Constitutional:   ?   General: He is not in acute distress. ?   Appearance: Normal appearance. He is normal weight. He is not ill-appearing or diaphoretic.  ?Eyes:  ?   General: No scleral icterus.    ?   Right eye: No discharge.     ?   Left eye: No discharge.  ?   Conjunctiva/sclera: Conjunctivae normal.  ?   Pupils: Pupils are equal, round, and reactive to light.  ?Neck:  ?   Vascular: No carotid bruit.  ?Cardiovascular:  ?   Rate and Rhythm: Normal rate and regular rhythm.  ?   Pulses: Normal pulses.  ?   Heart sounds: Normal heart sounds. No murmur heard. ?  No friction rub. No gallop.  ?Pulmonary:  ?   Effort: Pulmonary effort is normal. No respiratory distress.  ?   Breath sounds: Normal breath sounds. No wheezing, rhonchi or rales.  ?Chest:  ?   Chest wall: No tenderness.  ?Abdominal:  ?   General: Bowel sounds are normal. There is no distension.  ?   Palpations: Abdomen is soft. There is no mass.  ?   Tenderness: There is no abdominal tenderness. There is no right CVA tenderness, left CVA tenderness, guarding or rebound.  ?Musculoskeletal:  ?   Cervical back: Normal range of motion. No rigidity or tenderness.  ?Lymphadenopathy:  ?   Cervical: No cervical adenopathy.  ?Skin: ?   General: Skin is warm and dry.  ?   Coloration: Skin is not pale.  ?   Findings: No bruising, erythema, lesion or rash.  ?Neurological:  ?   Mental Status: He is alert and oriented to person, place, and time.  ?   Motor: No weakness.  ?   Gait: Gait normal.  ?Psychiatric:     ?   Mood and Affect: Mood normal.     ?   Speech: Speech normal.     ?   Behavior: Behavior normal.  ? ? ?Labs reviewed: ?Recent Labs  ?  07/07/21 ?1144  ?NA 138  ?K 4.5  ?CL 105  ?CO2 26  ?GLUCOSE 91  ?BUN 15  ?CREATININE  0.87  ?CALCIUM 9.2  ? ?Recent Labs  ?  07/07/21 ?1144  ?AST 27  ?ALT 26  ?BILITOT 0.4  ?PROT 7.2  ? ?Recent Labs  ?  07/07/21 ?1144  ?WBC 5.8  ?NEUTROABS 3,631  ?HGB 15.0  ?HCT 44.6  ?MCV 85.8  ?PLT 271  ? ?Lab Results  ?Component Value Date  ? TSH 0.80 07/07/2021  ? ?No results found for: HGBA1C ?No results found for: CHOL, HDL, LDLCALC, LDLDIRECT, TRIG, CHOLHDL ? ?Significant Diagnostic Results in last 30 days:  ?No results found. ? ?Assessment/Plan ? ?1. Screening  examination for STD (sexually transmitted disease) ?Reports new sexual partners without using any protection.  Request for screening for chlamydia due to dysuria presented in the past with similar symptoms when he had chlamydia.  No discharge noted.  Specimen obtained using penile swab.  Chaperone [place], CMA present during procedure ?- N. gonorrhoea and Chlamydia by PCR (IPS) ?- HIV antibody (with reflex) ? ?2. Dysuria ?Afebrile ?We will rule out urinary tract infection.  STD screening as above. ?- Urine Culture ?- POC Urinalysis Dipstick indicates yellow clear urine with moderate blood, positive for ketones, nitrites and large 3+ leukocytes will send urine for culture. ?- Advised to notify provider if symptoms worsen or running any fever or chills ? ?Family/ staff Communication: Reviewed plan of care with patient verbalized understanding  ? ?Labs/tests ordered:  ?- Urine Culture ?- POC Urinalysis Dipstick ? ?Next Appointment: As needed if symptoms worsen or fail to improve ? ?Sandrea Hughs, NP ?

## 2021-08-05 LAB — URINE CULTURE
MICRO NUMBER:: 13315724
Result:: NO GROWTH
SPECIMEN QUALITY:: ADEQUATE

## 2021-08-05 LAB — HIV ANTIBODY (ROUTINE TESTING W REFLEX): HIV 1&2 Ab, 4th Generation: NONREACTIVE

## 2021-08-06 NOTE — Telephone Encounter (Signed)
Final urine culture did not show any signs of infection so need for treatment at the moment.will wait for final chlamydia results prior to treatment. ?HIV results were negative.  ?If symptoms persist will refer you to a Urologist for further evaluation.  ?

## 2021-08-10 ENCOUNTER — Other Ambulatory Visit: Payer: Self-pay

## 2021-08-10 MED ORDER — DOXYCYCLINE HYCLATE 100 MG PO TABS: 100.0000 mg | ORAL_TABLET | Freq: Two times a day (BID) | ORAL | 0 refills | Status: AC

## 2021-08-11 LAB — HERPES SIMPLEX VIRUS, TYPE 1 AND 2 DNA,QUAL,RT PCR
HSV 1 DNA: NOT DETECTED
HSV 2 DNA: NOT DETECTED

## 2021-08-11 LAB — IPS N GONORRHOEA AND CHLAMYDIA BY PCR
C. trachomatis RNA, TMA: DETECTED — AB
N. gonorrhoeae RNA, TMA: NOT DETECTED

## 2021-10-11 ENCOUNTER — Telehealth (INDEPENDENT_AMBULATORY_CARE_PROVIDER_SITE_OTHER): Payer: BC Managed Care – PPO | Admitting: Family

## 2021-10-11 ENCOUNTER — Encounter: Payer: Self-pay | Admitting: Family

## 2021-10-11 ENCOUNTER — Telehealth: Payer: Self-pay

## 2021-10-11 DIAGNOSIS — A6002 Herpesviral infection of other male genital organs: Secondary | ICD-10-CM

## 2021-10-11 MED ORDER — VALACYCLOVIR HCL 1 G PO TABS: 1000.0000 mg | ORAL_TABLET | Freq: Every day | ORAL | 1 refills | Status: DC

## 2021-10-11 NOTE — Telephone Encounter (Signed)
This service is provided via telemedicine  No vital signs collected/recorded due to the encounter was a telemedicine visit.   Location of patient (ex: home, work):  home  Patient consents to a telephone visit:  yes  Location of the provider (ex: office, home):  [facility]  Name of any referring provider:  N/A  Names of all persons participating in the telemedicine service and their role in the encounter:  Ngetich, Donalee Citrin, NP, Guss Bunde [facility], patient   Time spent on call:  5 min

## 2021-10-11 NOTE — Progress Notes (Signed)
Provider: Marlowe Sax FNP-C  Maclovia Uher, Nelda Bucks, NP  Patient Care Team: Draken Farrior, Nelda Bucks, NP as PCP - General (Family Medicine)  Extended Emergency Contact Information Primary Emergency Contact: Dorothea Glassman Address: [place], [place] Phone: [phone] Work Phone: [phone] Relation: Mother Secondary Emergency Contact: Midge Aver Address: [address]          [place], [place] [address] [place] Phone: [phone] Work Phone: [phone] Relation: Father  Code Status:  Full Code  Goals of care: Advanced Directive information    10/11/2021   11:45 AM  Advanced Directives  Does Patient Have a Medical Advance Directive? No  Would patient like information on creating a medical advance directive? No - Patient declined     Chief Complaint  Patient presents with   Acute Visit    PT wants to start new medication. Video visit    HPI:  Pt is a 31 y.o. male seen today for an acute visit for evaluation of herpes break out.Has had on and off break out.recently passed out to someone without any break out.He would like to start on valtrex for suppression.  He denies any fever,chills or rash.    Past Medical History:  Diagnosis Date   Acne    ADHD (attention deficit hyperactivity disorder)    Allergy    Anxiety    Asthma    Dysthymic disorder    Pneumonia    h/o   Reactive airway disease    Tobacco use disorder    Past Surgical History:  Procedure Laterality Date   WISDOM TOOTH EXTRACTION  2012    No Known Allergies  Outpatient Encounter Medications as of 10/11/2021  Medication Sig   cyclobenzaprine (FLEXERIL) 10 MG tablet Take 1 tablet (10 mg total) by mouth 3 (three) times daily as needed for muscle spasms.   FLUoxetine (PROZAC) 20 MG capsule Take 1 capsule (20 mg total) by mouth daily.   gabapentin (NEURONTIN) 100 MG capsule Take 1 capsule (100 mg total) by mouth 3 (three) times  daily.   valACYclovir (VALTREX) 1000 MG tablet Take 1 tablet (1,000 mg total) by mouth daily.   [DISCONTINUED] omeprazole (PRILOSEC) 20 MG capsule Take 20 mg by mouth daily.   No facility-administered encounter medications on file as of 10/11/2021.    Review of Systems  Constitutional:  Negative for appetite change, chills, fatigue and fever.  Gastrointestinal:  Negative for abdominal distention, abdominal pain, nausea and vomiting.  Genitourinary:  Negative for dysuria, flank pain, frequency, genital sores, penile discharge, penile pain and urgency.  Skin:  Negative for color change, pallor and rash.       Herpes break out on and off   Neurological:  Negative for dizziness, light-headedness and headaches.    Immunization History  Administered Date(s) Administered   DTaP 03/30/1992, 05/29/1992, 07/30/1992, 05/21/1993, 07/29/1997   HPV Quadrivalent 09/01/2010   Hepatitis A 02/23/2007, 05/01/2009   Hepatitis B 02/13/1992, 03/30/1992, 07/30/1992   HiB (PRP-OMP) 03/30/1992, 05/29/1992, 07/30/1992, 05/21/1993   IPV 03/30/1992, 05/29/1992, 07/30/1992, 07/29/1997   MMR 05/21/1993, 07/29/1997   Meningococcal Conjugate 09/01/2010   Meningococcal Polysaccharide 02/23/2007   PFIZER Comirnaty(Gray Top)Covid-19 Tri-Sucrose Vaccine 07/02/2019, 07/23/2019   Tdap 09/09/2005   Pertinent  Health Maintenance Due  Topic Date Due   INFLUENZA VACCINE  11/09/2021      07/07/2021   10:37 AM 08/04/2021    2:41 PM 10/11/2021  11:46 AM  Fall Risk  Falls in the past year? 0 0 0  Was there an injury with Fall? 0 0 0  Fall Risk Category Calculator 0 0 0  Fall Risk Category Low Low Low  Patient Fall Risk Level Low fall risk Low fall risk Low fall risk  Patient at Risk for Falls Due to  No Fall Risks No Fall Risks  Fall risk Follow up  Falls evaluation completed Falls evaluation completed   Functional Status Survey:    There were no vitals filed for this visit. There is no height or weight on file to  calculate BMI. Physical Exam  Unable to complete on telephone visit   Labs reviewed: Recent Labs    07/07/21 1144  NA 138  K 4.5  CL 105  CO2 26  GLUCOSE 91  BUN 15  CREATININE 0.87  CALCIUM 9.2   Recent Labs    07/07/21 1144  AST 27  ALT 26  BILITOT 0.4  PROT 7.2   Recent Labs    07/07/21 1144  WBC 5.8  NEUTROABS 3,631  HGB 15.0  HCT 44.6  MCV 85.8  PLT 271   Lab Results  Component Value Date   TSH 0.80 07/07/2021   No results found for: "HGBA1C" No results found for: "CHOL", "HDL", "LDLCALC", "LDLDIRECT", "TRIG", "CHOLHDL"  Significant Diagnostic Results in last 30 days:  No results found.  Assessment/Plan Herpes genitalis in men Has had out break on and off recently passed it to someone else.No current herpes rash.would like to start on valtrex for suppression. Latest lab reviewed CR 0.87 with normal liver enzymes.will start on valtrex 1 GM side effect discussed.will follow up in one month to recheck  CMP level.  - valACYclovir (VALTREX) 1000 MG tablet; Take 1 tablet (1,000 mg total) by mouth daily.  Dispense: 90 tablet; Refill: 1 - CMP with eGFR([facility]); Future  Family/ staff Communication: Reviewed plan of care with patient verbalized understanding   Labs/tests ordered: CMP  Next Appointment: 1 month for labs   I connected with  Curtis Howard on 10/11/21 by a Telephone visit enabled telemedicine application and verified that I am speaking with the correct person using two identifiers.   I discussed the limitations of evaluation and management by telemedicine. The patient expressed understanding and agreed to proceed.   Spent 11 minutes of non-face to face with patient  >50% time spent counseling; reviewing medical record; labs; and developing future plan of care.    Sandrea Hughs, NP

## 2021-10-11 NOTE — Progress Notes (Signed)
This service is provided via telemedicine  No vital signs collected/recorded due to the encounter was a telemedicine visit.   Location of patient (ex: home, work):  home  Patient consents to a telephone visit:  yes  Location of the provider (ex: office, home):  [facility] Name of any referring provider:  N/A Names of all persons participating in the telemedicine service and their role in the encounter:   Ngetich, Donalee Citrin, NP, Guss Bunde [facility], patient  Time spent on call:  5 minutes

## 2021-10-19 ENCOUNTER — Encounter (HOSPITAL_COMMUNITY): Payer: Self-pay | Admitting: Psychiatry

## 2021-10-19 ENCOUNTER — Telehealth (INDEPENDENT_AMBULATORY_CARE_PROVIDER_SITE_OTHER): Payer: No Payment, Other | Admitting: Psychiatry

## 2021-10-19 DIAGNOSIS — F411 Generalized anxiety disorder: Secondary | ICD-10-CM

## 2021-10-19 MED ORDER — FLUOXETINE HCL 20 MG PO CAPS
20.0000 mg | ORAL_CAPSULE | Freq: Every day | ORAL | 5 refills | Status: DC
Start: 2021-10-19 — End: 2022-07-16

## 2021-10-19 MED ORDER — GABAPENTIN 100 MG PO CAPS: 100.0000 mg | ORAL_CAPSULE | Freq: Three times a day (TID) | ORAL | 5 refills | Status: DC

## 2021-10-19 NOTE — Progress Notes (Signed)
BH NP OP Progress Note  Patient Identification: Curtis Howard MRN:  [id number] Date of Evaluation:  10/19/2021 Referral Source: self Chief Complaint:   Chief Complaint   Anxiety; Follow-up    Visit Diagnosis: General anxiety d/o  History of Present Illness:  31 yo male presents with anxiety and depression follow up after starting Prozac daily and gabapentin PRN.  He denies having any depression. Anxiety is "always there" but on a low level, no panic attacks.  Sleep with "no problem", appetite is increased and he is working on a healthy diet and exercise. Denies side effects from his medications.  No suicidal/homicidal ideations, hallucinations, or substance abuse.  Past Psychiatric History: depression, anxiety, ADHD  Consequences of Substance Abuse: NA  Past Medical History:  Past Medical History:  Diagnosis Date   Acne    ADHD (attention deficit hyperactivity disorder)    Allergy    Anxiety    Asthma    Dysthymic disorder    Pneumonia    h/o   Reactive airway disease    Tobacco use disorder     Past Surgical History:  Procedure Laterality Date   WISDOM TOOTH EXTRACTION  2012    Family Psychiatric History: none  Family History:  Family History  Problem Relation Age of Onset   Depression Mother    Diabetes Mother    Alcohol abuse Father    Depression Father     Social History:   Social History   Socioeconomic History   Marital status: Single    Spouse name: Not on file   Number of children: Not on file   Years of education: Not on file   Highest education level: Not on file  Occupational History   Not on file  Tobacco Use   Smoking status: Former    Types: Cigarettes    Quit date: 03/12/2018    Years since quitting: 3.6   Smokeless tobacco: Current    Types: Chew  Vaping Use   Vaping Use: Every day  Substance and Sexual Activity   Alcohol use: No   Drug use: No   Sexual activity: Yes    Partners: Female  Other Topics Concern   Not on file   Social History Narrative   Not on file   Social Determinants of Health   Financial Resource Strain: Low Risk  (09/22/2020)   Overall Financial Resource Strain (CARDIA)    Difficulty of Paying Living Expenses: Not hard at all  Food Insecurity: No Food Insecurity (09/22/2020)   Hunger Vital Sign    Worried About Running Out of Food in the Last Year: Never true    Ran Out of Food in the Last Year: Never true  Transportation Needs: No Transportation Needs (09/22/2020)   PRAPARE - Administrator, Civil Service (Medical): No    Lack of Transportation (Non-Medical): No  Physical Activity: Sufficiently Active (09/22/2020)   Exercise Vital Sign    Days of Exercise per Week: 4 days    Minutes of Exercise per Session: 60 min  Stress: Stress Concern Present (09/22/2020)   [employer] of Occupational Health - Occupational Stress Questionnaire    Feeling of Stress : Rather much  Social Connections: Moderately Isolated (09/22/2020)   Social Connection and Isolation Panel [NHANES]    Frequency of Communication with Friends and Family: Three times a week    Frequency of Social Gatherings with Friends and Family: Twice a week    Attends Religious Services: 1 to 4 times  per year    Active Member of Clubs or Organizations: No    Attends Archivist Meetings: Never    Marital Status: Never married    Additional Social History: works independently as a Radio broadcast assistant, seeking more stable work.  Allergies:  No Known Allergies  Metabolic Disorder Labs: No results found for: "HGBA1C", "MPG" No results found for: "PROLACTIN" No results found for: "CHOL", "TRIG", "HDL", "CHOLHDL", "VLDL", "LDLCALC" Lab Results  Component Value Date   TSH 0.80 07/07/2021    Therapeutic Level Labs: No results found for: "LITHIUM" No results found for: "CBMZ" No results found for: "VALPROATE"  Current Medications: Current Outpatient Medications  Medication Sig Dispense Refill    cyclobenzaprine (FLEXERIL) 10 MG tablet Take 1 tablet (10 mg total) by mouth 3 (three) times daily as needed for muscle spasms. 30 tablet 3   FLUoxetine (PROZAC) 20 MG capsule Take 1 capsule (20 mg total) by mouth daily. 30 capsule 5   gabapentin (NEURONTIN) 100 MG capsule Take 1 capsule (100 mg total) by mouth 3 (three) times daily. 90 capsule 5   valACYclovir (VALTREX) 1000 MG tablet Take 1 tablet (1,000 mg total) by mouth daily. 90 tablet 1   No current facility-administered medications for this visit.    Musculoskeletal: Strength & Muscle Tone: within normal limits Gait & Station: normal Patient leans: N/A  Psychiatric Specialty Exam: Review of Systems  Psychiatric/Behavioral:  The patient is nervous/anxious.   All other systems reviewed and are negative.   There were no vitals taken for this visit.There is no height or weight on file to calculate BMI.  General Appearance: Casual  Eye Contact:  Good  Speech:  Normal Rate  Volume:  Normal  Mood:  Anxious  Affect:  Congruent  Thought Process:  Coherent and Descriptions of Associations: Intact  Orientation:  Full (Time, Place, and Person)  Thought Content:  WDL and Logical  Suicidal Thoughts:  No  Homicidal Thoughts:  No  Memory:  Immediate;   Good Recent;   Good Remote;   Good  Judgement:  Good  Insight:  Good  Psychomotor Activity:  Normal  Concentration:  Concentration: Good and Attention Span: Good  Recall:  Good  Fund of Knowledge:Good  Language: Good  Akathisia:  No  Handed:  Right  AIMS (if indicated):  not done  Assets:  Housing Leisure Time Physical Health Resilience Social Support  ADL's:  Intact  Cognition: WNL  Sleep:  Good   Screenings: GAD-7    Health and safety inspector from 09/22/2020 in [facility]  Total GAD-7 Score 11      PHQ2-9    [place] Office Visit from 07/07/2021 in [facility] Office Visit from 09/28/2020 in [facility] Counselor from 09/22/2020 in [facility]  PHQ-2 Total Score 0 1 1  PHQ-9 Total Score 3 -- --      [place] Office Visit from 09/28/2020 in [facility] Counselor from 09/22/2020 in [facility] No Risk No Risk       Assessment and Plan:  Major depressive disorder, recurrent, mild: -Continue Prozac 20 mg daily  General anxiety disorder: -Continue gabapentin 100 mg TID PRN  Virtual Visit via Video Note  I connected with Curtis Howard on 10/19/21 at 10:00 AM EDT by a video enabled telemedicine application and verified that I am speaking with the correct person  using two identifiers.  Location: Patient: home Provider: work office   I discussed the limitations of evaluation and management by telemedicine and the availability of in person appointments. The patient expressed understanding and agreed to proceed.  Follow Up Instructions: Follow up in 6 months   I discussed the assessment and treatment plan with the patient. The patient was provided an opportunity to ask questions and all were answered. The patient agreed with the plan and demonstrated an understanding of the instructions.   The patient was advised to call back or seek an in-person evaluation if the symptoms worsen or if the condition fails to improve as anticipated.  I provided 10 minutes of non-face-to-face time during this encounter.   Nanine Means, NP    Nanine Means, NP 7/11/202310:04 AM

## 2021-11-10 ENCOUNTER — Other Ambulatory Visit (INDEPENDENT_AMBULATORY_CARE_PROVIDER_SITE_OTHER): Payer: BC Managed Care – PPO

## 2021-11-10 DIAGNOSIS — A6002 Herpesviral infection of other male genital organs: Secondary | ICD-10-CM

## 2021-11-11 LAB — COMPLETE METABOLIC PANEL WITH GFR
AG Ratio: 1.9 (calc) (ref 1.0–2.5)
ALT: 32 U/L (ref 9–46)
AST: 28 U/L (ref 10–40)
Albumin: 4.5 g/dL (ref 3.6–5.1)
Alkaline phosphatase (APISO): 52 U/L (ref 36–130)
BUN: 12 mg/dL (ref 7–25)
CO2: 28 mmol/L (ref 20–32)
Calcium: 9.2 mg/dL (ref 8.6–10.3)
Chloride: 105 mmol/L (ref 98–110)
Creat: 1.04 mg/dL (ref 0.60–1.24)
Globulin: 2.4 g/dL (calc) (ref 1.9–3.7)
Glucose, Bld: 94 mg/dL (ref 65–99)
Potassium: 4 mmol/L (ref 3.5–5.3)
Sodium: 141 mmol/L (ref 135–146)
Total Bilirubin: 0.5 mg/dL (ref 0.2–1.2)
Total Protein: 6.9 g/dL (ref 6.1–8.1)
eGFR: 100 mL/min/{1.73_m2} (ref >=60)

## 2022-05-23 ENCOUNTER — Other Ambulatory Visit: Payer: Self-pay

## 2022-05-23 DIAGNOSIS — A6002 Herpesviral infection of other male genital organs: Secondary | ICD-10-CM

## 2022-05-23 MED ORDER — VALACYCLOVIR HCL 1 G PO TABS: 1000.0000 mg | ORAL_TABLET | Freq: Every day | ORAL | 1 refills | Status: DC

## 2022-07-08 ENCOUNTER — Ambulatory Visit: Payer: BC Managed Care – PPO | Admitting: Family

## 2022-07-15 ENCOUNTER — Other Ambulatory Visit (HOSPITAL_COMMUNITY): Payer: Self-pay | Admitting: Psychiatry

## 2022-07-15 ENCOUNTER — Other Ambulatory Visit: Payer: Self-pay | Admitting: Family

## 2022-11-14 ENCOUNTER — Other Ambulatory Visit: Payer: Self-pay | Admitting: Family

## 2022-11-14 DIAGNOSIS — A6002 Herpesviral infection of other male genital organs: Secondary | ICD-10-CM

## 2023-01-16 ENCOUNTER — Other Ambulatory Visit (HOSPITAL_COMMUNITY): Payer: Self-pay | Admitting: Psychiatry

## 2023-01-31 ENCOUNTER — Ambulatory Visit (INDEPENDENT_AMBULATORY_CARE_PROVIDER_SITE_OTHER): Payer: Medicaid Other | Admitting: Family

## 2023-01-31 ENCOUNTER — Ambulatory Visit: Payer: Medicaid Other | Admitting: Family

## 2023-01-31 ENCOUNTER — Encounter: Payer: Self-pay | Admitting: Family

## 2023-01-31 VITALS — BP 132/74 | HR 85 | Temp 97.8°F | Resp 20 | Ht 67.0 in | Wt 166.4 lb

## 2023-01-31 DIAGNOSIS — F330 Major depressive disorder, recurrent, mild: Secondary | ICD-10-CM

## 2023-01-31 DIAGNOSIS — Z113 Encounter for screening for infections with a predominantly sexual mode of transmission: Secondary | ICD-10-CM

## 2023-01-31 DIAGNOSIS — A6002 Herpesviral infection of other male genital organs: Secondary | ICD-10-CM

## 2023-01-31 DIAGNOSIS — F411 Generalized anxiety disorder: Secondary | ICD-10-CM

## 2023-01-31 DIAGNOSIS — K219 Gastro-esophageal reflux disease without esophagitis: Secondary | ICD-10-CM

## 2023-01-31 MED ORDER — CYCLOBENZAPRINE HCL 10 MG PO TABS: 10.0000 mg | ORAL_TABLET | Freq: Three times a day (TID) | ORAL | 3 refills | Status: DC | PRN

## 2023-01-31 MED ORDER — FLUOXETINE HCL 20 MG PO CAPS
20.0000 mg | ORAL_CAPSULE | Freq: Every day | ORAL | 5 refills | Status: DC
Start: 2023-01-31 — End: 2023-09-13

## 2023-01-31 MED ORDER — GABAPENTIN 100 MG PO CAPS
100.0000 mg | ORAL_CAPSULE | Freq: Three times a day (TID) | ORAL | 5 refills | Status: DC
Start: 2023-01-31 — End: 2023-09-13

## 2023-01-31 MED ORDER — VALACYCLOVIR HCL 1 G PO TABS
1000.0000 mg | ORAL_TABLET | Freq: Every day | ORAL | 1 refills | Status: DC
Start: 2023-01-31 — End: 2023-11-16

## 2023-01-31 NOTE — Patient Instructions (Signed)
-   Resource Books recommended:  SOS for emotion: Managing anxiety,anger Depression way out by Brien Few  Telling yourself the Truth

## 2023-01-31 NOTE — Progress Notes (Signed)
Provider: Richarda Blade FNP-C   Antavius Sperbeck, Donalee Citrin, NP  Patient Care Team: Kaydie Petsch, Donalee Citrin, NP as PCP - General (Family Medicine)  Extended Emergency Contact Information Primary Emergency Contact: Derrell Lolling Address: [address]          [place], [place] Home Phone: [phone] Work Phone: [phone] Relation: Mother Secondary Emergency Contact: Loma Boston Address: [address]          [place], [place] [address] [place] Home Phone: [phone] Work Phone: [phone] Relation: Father  Code Status:  Full Code  Goals of care: Advanced Directive information    01/31/2023    1:05 PM  Advanced Directives  Does Patient Have a Medical Advance Directive? No  Would patient like information on creating a medical advance directive? No - Patient declined     Chief Complaint  Patient presents with   Medical Management of Chronic Issues    Patient presents today for a year follow-up    HPI:  Pt is a 32 y.o. male seen today for annual follow up for medical management of chronic diseases. Has not been seen here over one year ago.States thought did not have insurance.   Depression /Anxiety - has worsen has had less energy and more depressed.Psychiatrist moved out.will need referral.also need medication refill.Sees a Warden/ranger. Has been exercising.got a dog so has been walking more.  Work has been slow so might have contributed increased depression.   Request lab work including STD screening. No recent breakout of Herpes.Has been taking  valtrex daily.    Past Medical History:  Diagnosis Date   Acne    ADHD (attention deficit hyperactivity disorder)    Allergy    Anxiety    Asthma    Dysthymic disorder    Pneumonia    h/o   Reactive airway disease    Tobacco use disorder    Past Surgical History:  Procedure Laterality Date   HAND SURGERY  07/2018   WISDOM TOOTH EXTRACTION  04/11/2010    No Known  Allergies  Allergies as of 01/31/2023   No Known Allergies      Medication List        Accurate as of January 31, 2023  1:15 PM. If you have any questions, ask your nurse or doctor.          cyclobenzaprine 10 MG tablet Commonly known as: FLEXERIL Take 1 tablet (10 mg total) by mouth 3 (three) times daily as needed for muscle spasms.   FLUoxetine 20 MG capsule Commonly known as: PROZAC TAKE 1 CAPSULE (20 MG TOTAL) BY MOUTH DAILY.   gabapentin 100 MG capsule Commonly known as: Neurontin Take 1 capsule (100 mg total) by mouth 3 (three) times daily.   valACYclovir 1000 MG tablet Commonly known as: VALTREX TAKE 1 TABLET BY MOUTH DAILY        Review of Systems  Constitutional:  Negative for appetite change, chills, fatigue, fever and unexpected weight change.  HENT:  Negative for congestion, dental problem, ear discharge, ear pain, facial swelling, hearing loss, nosebleeds, postnasal drip, rhinorrhea, sinus pressure, sinus pain, sneezing, sore throat, tinnitus and trouble swallowing.   Eyes:  Negative for pain, discharge, redness, itching and visual disturbance.  Respiratory:  Negative for cough, chest tightness, shortness of breath and wheezing.   Cardiovascular:  Negative for chest pain, palpitations and leg swelling.  Gastrointestinal:  Negative for abdominal distention, abdominal pain, blood in stool, constipation, diarrhea, nausea and  vomiting.  Endocrine: Negative for cold intolerance, heat intolerance, polydipsia, polyphagia and polyuria.  Genitourinary:  Negative for difficulty urinating, dysuria, flank pain, frequency and urgency.  Musculoskeletal:  Negative for arthralgias, back pain, gait problem, joint swelling, myalgias, neck pain and neck stiffness.  Skin:  Negative for color change, pallor, rash and wound.  Neurological:  Negative for dizziness, syncope, speech difficulty, weakness, light-headedness, numbness and headaches.  Hematological:  Does not  bruise/bleed easily.  Psychiatric/Behavioral:  Negative for agitation, behavioral problems, confusion, hallucinations, self-injury, sleep disturbance and suicidal ideas. The patient is nervous/anxious.        Depression has worsen with his work slowing down     Immunization History  Administered Date(s) Administered   DTaP 03/30/1992, 05/29/1992, 07/30/1992, 05/21/1993, 07/29/1997   HIB (PRP-OMP) 03/30/1992, 05/29/1992, 07/30/1992, 05/21/1993   HPV Quadrivalent 09/01/2010   Hepatitis A 02/23/2007, 05/01/2009   Hepatitis B 02/13/1992, 03/30/1992, 07/30/1992   IPV 03/30/1992, 05/29/1992, 07/30/1992, 07/29/1997   MMR 05/21/1993, 07/29/1997   Meningococcal Conjugate 09/01/2010   Meningococcal polysaccharide vaccine (MPSV4) 02/23/2007   PFIZER Comirnaty(Gray Top)Covid-19 Tri-Sucrose Vaccine 07/02/2019, 07/23/2019   Tdap 09/09/2005   Pertinent  Health Maintenance Due  Topic Date Due   INFLUENZA VACCINE  Never done      07/07/2021   10:37 AM 08/04/2021    2:41 PM 10/11/2021   11:46 AM 01/31/2023    1:04 PM  Fall Risk  Falls in the past year? 0 0 0 0  Was there an injury with Fall? 0 0 0 0  Fall Risk Category Calculator 0 0 0 0  Fall Risk Category (Retired) Low Low Low   (RETIRED) Patient Fall Risk Level Low fall risk Low fall risk Low fall risk   Patient at Risk for Falls Due to  No Fall Risks No Fall Risks No Fall Risks  Fall risk Follow up  Falls evaluation completed Falls evaluation completed Falls evaluation completed   Functional Status Survey:    Vitals:   01/31/23 1257  BP: 132/74  Pulse: 85  Resp: 20  Temp: 97.8 F (36.6 C)  SpO2: 98%  Weight: 166 lb 6.4 oz (75.5 kg)  Height: 5\' 7"  (1.702 m)   Body mass index is 26.06 kg/m. Physical Exam Vitals reviewed.  Constitutional:      General: He is not in acute distress.    Appearance: Normal appearance. He is overweight. He is not ill-appearing or diaphoretic.  HENT:     Head: Normocephalic.     Right Ear: Tympanic  membrane, ear canal and external ear normal. There is no impacted cerumen.     Left Ear: Tympanic membrane, ear canal and external ear normal. There is no impacted cerumen.     Nose: Nose normal. No congestion or rhinorrhea.     Mouth/Throat:     Mouth: Mucous membranes are moist.     Pharynx: Oropharynx is clear. No oropharyngeal exudate or posterior oropharyngeal erythema.  Eyes:     General: No scleral icterus.       Right eye: No discharge.        Left eye: No discharge.     Extraocular Movements: Extraocular movements intact.     Conjunctiva/sclera: Conjunctivae normal.     Pupils: Pupils are equal, round, and reactive to light.  Neck:     Vascular: No carotid bruit.  Cardiovascular:     Rate and Rhythm: Normal rate and regular rhythm.     Pulses: Normal pulses.     Heart sounds: Normal  heart sounds. No murmur heard.    No friction rub. No gallop.  Pulmonary:     Effort: Pulmonary effort is normal. No respiratory distress.     Breath sounds: Normal breath sounds. No wheezing, rhonchi or rales.  Chest:     Chest wall: No tenderness.  Abdominal:     General: Bowel sounds are normal. There is no distension.     Palpations: Abdomen is soft. There is no mass.     Tenderness: There is no abdominal tenderness. There is no right CVA tenderness, left CVA tenderness, guarding or rebound.  Musculoskeletal:        General: No swelling or tenderness. Normal range of motion.     Cervical back: Normal range of motion. No rigidity or tenderness.     Right lower leg: No edema.     Left lower leg: No edema.  Lymphadenopathy:     Cervical: No cervical adenopathy.  Skin:    General: Skin is warm and dry.     Coloration: Skin is not pale.     Findings: No bruising, erythema, lesion or rash.  Neurological:     Mental Status: He is alert and oriented to person, place, and time.     Cranial Nerves: No cranial nerve deficit.     Sensory: No sensory deficit.     Motor: No weakness.      Coordination: Coordination normal.     Gait: Gait normal.  Psychiatric:        Mood and Affect: Mood normal.        Speech: Speech normal.        Behavior: Behavior normal.        Thought Content: Thought content normal.        Judgment: Judgment normal.     Labs reviewed: No results for input(s): "NA", "K", "CL", "CO2", "GLUCOSE", "BUN", "CREATININE", "CALCIUM", "MG", "PHOS" in the last 8760 hours. No results for input(s): "AST", "ALT", "ALKPHOS", "BILITOT", "PROT", "ALBUMIN" in the last 8760 hours. No results for input(s): "WBC", "NEUTROABS", "HGB", "HCT", "MCV", "PLT" in the last 8760 hours. Lab Results  Component Value Date   TSH 0.80 07/07/2021   No results found for: "HGBA1C" No results found for: "CHOL", "HDL", "LDLCALC", "LDLDIRECT", "TRIG", "CHOLHDL"  Significant Diagnostic Results in last 30 days:  No results found.  Assessment/Plan 1. Herpes genitalis in men Valtrex effective  - valACYclovir (VALTREX) 1000 MG tablet; Take 1 tablet (1,000 mg total) by mouth daily.  Dispense: 90 tablet; Refill: 1  2. Generalized anxiety disorder Restart on Prozac  - deep breathing exercise advised  - Listen to classical music for relaxation  - Ambulatory referral to Psychiatry - FLUoxetine (PROZAC) 20 MG capsule; Take 1 capsule (20 mg total) by mouth daily.  Dispense: 30 capsule; Refill: 5 - gabapentin (NEURONTIN) 100 MG capsule; Take 1 capsule (100 mg total) by mouth 3 (three) times daily.  Dispense: 90 capsule; Refill: 5 - TSH  3. Major depressive disorder, recurrent episode, mild (HCC) - Refill Fluoxetine  - will need to establish with a new Psychiatry  - Ambulatory referral to Psychiatry - FLUoxetine (PROZAC) 20 MG capsule; Take 1 capsule (20 mg total) by mouth daily.  Dispense: 30 capsule; Refill: 5 - COMPLETE METABOLIC PANEL WITH GFR  4. Gastroesophageal reflux disease without esophagitis Symptoms controlled  - CBC with Differential/Platelet  5. Screening examination  for STD (sexually transmitted disease) No high risk behavioral reported  - Chlamydia/Neisseria Gonorrhoeae RNA,TMA,Urogenital  Family/ staff Communication: Reviewed  plan of care with patient verbalized understanding   Labs/tests ordered:  - TSH - CBC with Differential/Platelet - COMPLETE METABOLIC PANEL WITH GFR - Chlamydia/Neisseria Gonorrhoeae RNA,TMA,Urogenital  Next Appointment : Return in about 1 year (around 01/31/2024) for annual Physical examination.   Curtis Bookman, NP

## 2023-02-01 ENCOUNTER — Ambulatory Visit: Payer: Medicaid Other | Admitting: Family

## 2023-02-01 LAB — COMPLETE METABOLIC PANEL WITH GFR
AG Ratio: 1.9 (calc) (ref 1.0–2.5)
ALT: 26 U/L (ref 9–46)
AST: 23 U/L (ref 10–40)
Albumin: 4.4 g/dL (ref 3.6–5.1)
Alkaline phosphatase (APISO): 47 U/L (ref 36–130)
BUN: 14 mg/dL (ref 7–25)
CO2: 27 mmol/L (ref 20–32)
Calcium: 9.3 mg/dL (ref 8.6–10.3)
Chloride: 106 mmol/L (ref 98–110)
Creat: 1.02 mg/dL (ref 0.60–1.26)
Globulin: 2.3 g/dL (ref 1.9–3.7)
Glucose, Bld: 85 mg/dL (ref 65–99)
Potassium: 4.3 mmol/L (ref 3.5–5.3)
Sodium: 142 mmol/L (ref 135–146)
Total Bilirubin: 0.5 mg/dL (ref 0.2–1.2)
Total Protein: 6.7 g/dL (ref 6.1–8.1)
eGFR: 101 mL/min/{1.73_m2} (ref >=60)

## 2023-02-01 LAB — CBC WITH DIFFERENTIAL/PLATELET
Absolute Lymphocytes: 1637 {cells}/uL (ref 850–3900)
Absolute Monocytes: 372 {cells}/uL (ref 200–950)
Basophils Absolute: 20 {cells}/uL (ref 0–200)
Basophils Relative: 0.4 %
Eosinophils Absolute: 133 {cells}/uL (ref 15–500)
Eosinophils Relative: 2.6 %
HCT: 43.9 % (ref 38.5–50.0)
Hemoglobin: 14.7 g/dL (ref 13.2–17.1)
MCH: 31.4 pg (ref 27.0–33.0)
MCHC: 33.5 g/dL (ref 32.0–36.0)
MCV: 93.8 fL (ref 80.0–100.0)
MPV: 9.4 fL (ref 7.5–12.5)
Monocytes Relative: 7.3 %
Neutro Abs: 2938 {cells}/uL (ref 1500–7800)
Neutrophils Relative %: 57.6 %
Platelets: 259 10*3/uL (ref 140–400)
RBC: 4.68 10*6/uL (ref 4.20–5.80)
RDW: 11.7 % (ref 11.0–15.0)
Total Lymphocyte: 32.1 %
WBC: 5.1 10*3/uL (ref 3.8–10.8)

## 2023-02-01 LAB — TSH: TSH: 0.65 m[IU]/L (ref 0.40–4.50)

## 2023-02-03 LAB — CHLAMYDIA/NEISSERIA GONORRHOEAE RNA,TMA,UROGENTIAL
C. trachomatis RNA, TMA: NOT DETECTED
N. gonorrhoeae RNA, TMA: NOT DETECTED

## 2023-05-24 ENCOUNTER — Ambulatory Visit
Admission: RE | Admit: 2023-05-24 | Discharge: 2023-05-24 | Disposition: A | Discharge status: Home / Self Care | Payer: Medicaid Other | Source: Ambulatory Visit | Attending: Sports Medicine | Admitting: Sports Medicine

## 2023-05-24 ENCOUNTER — Ambulatory Visit (INDEPENDENT_AMBULATORY_CARE_PROVIDER_SITE_OTHER): Payer: BC Managed Care – PPO | Admitting: Sports Medicine

## 2023-05-24 ENCOUNTER — Ambulatory Visit: Admission: RE | Admit: 2023-05-24 | Payer: Medicaid Other | Source: Ambulatory Visit

## 2023-05-24 ENCOUNTER — Encounter: Payer: Self-pay | Admitting: Sports Medicine

## 2023-05-24 VITALS — BP 118/78 | HR 88 | Temp 97.4°F | Resp 16 | Ht 69.0 in | Wt 160.0 lb

## 2023-05-24 DIAGNOSIS — M25572 Pain in left ankle and joints of left foot: Secondary | ICD-10-CM | POA: Diagnosis not present

## 2023-05-24 DIAGNOSIS — M79672 Pain in left foot: Secondary | ICD-10-CM

## 2023-05-24 MED ORDER — MELOXICAM 15 MG PO TABS: 15.0000 mg | ORAL_TABLET | Freq: Every day | ORAL | 0 refills | Status: DC

## 2023-05-24 NOTE — Progress Notes (Signed)
Careteam: Patient Care Team: Ngetich, Donalee Citrin, NP as PCP - General (Family Medicine)  PLACE OF SERVICE:  [facility]  Advanced Directive information Does Patient Have a Medical Advance Directive?: No, Would patient like information on creating a medical advance directive?: No - Patient declined  No Known Allergies  Chief Complaint  Patient presents with   Acute Visit    Sprained ankle with pain.     Discussed the use of AI scribe software for clinical note transcription with the patient, who gave verbal consent to proceed.  History of Present Illness   Curtis Howard is a 33 year old male who presents with worsening ankle pain following a sprain.  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 Initially, there was no significant pain or swelling, but by the next day, both increased and have progressively worsened over the past week.  He rates the pain as 7 to 8 out of 10 when standing or putting weight on the foot, and describes it as a 10 when standing directly on the heel. He is currently limping and finds it difficult to put weight on the heel, often balancing on the ball of his foot to avoid pain. Pain is primarily in the heel and up the back of the ankle, with no pain in the front. No dizziness, lightheadedness, or other systemic symptoms.  He has been using a brace when on his feet and elevating the ankle when not in use. Initially took Tylenol and Aleve but stopped after a friend advised against it. Currently not taking any pain medication and is using ice for relief.         Review of Systems:  Review of Systems  Constitutional:  Negative for chills and fever.  HENT:  Negative for congestion and sore throat.   Eyes:  Negative for double vision.  Respiratory:  Negative for cough, sputum production and shortness of breath.   Cardiovascular:  Negative for chest pain, palpitations and leg swelling.  Gastrointestinal:  Negative  for abdominal pain, heartburn and nausea.  Genitourinary:  Negative for dysuria, frequency and hematuria.  Musculoskeletal:  Positive for joint pain. Negative for falls and myalgias.  Neurological:  Negative for dizziness, sensory change and focal weakness.   Negative unless indicated in HPI.   Past Medical History:  Diagnosis Date   Acne    ADHD (attention deficit hyperactivity disorder)    Allergy    Anxiety    Asthma    Dysthymic disorder    Pneumonia    h/o   Reactive airway disease    Tobacco use disorder    Past Surgical History:  Procedure Laterality Date   HAND SURGERY  07/2018   WISDOM TOOTH EXTRACTION  04/11/2010   Social History:   reports that he quit smoking about 5 years ago. His smoking use included cigarettes. His smokeless tobacco use includes chew. He reports that he does not drink alcohol and does not use drugs.  Family History  Problem Relation Age of Onset   Depression Mother    Diabetes Mother    Alcohol abuse Father    Depression Father     Medications: Patient's Medications  New Prescriptions   MELOXICAM (MOBIC) 15 MG TABLET    Take 1 tablet (15 mg total) by mouth daily.  Previous Medications   CYCLOBENZAPRINE (FLEXERIL) 10 MG TABLET    Take 1 tablet (10 mg total) by mouth 3 (three) times daily as  needed for muscle spasms.   FLUOXETINE (PROZAC) 20 MG CAPSULE    Take 1 capsule (20 mg total) by mouth daily.   FLUOXETINE (PROZAC) 40 MG CAPSULE    Take 40 mg by mouth daily.   GABAPENTIN (NEURONTIN) 100 MG CAPSULE    Take 1 capsule (100 mg total) by mouth 3 (three) times daily.   VALACYCLOVIR (VALTREX) 1000 MG TABLET    Take 1 tablet (1,000 mg total) by mouth daily.  Modified Medications   No medications on file  Discontinued Medications   No medications on file    Physical Exam: Vitals:   05/24/23 1056  BP: 118/78  Pulse: 88  Resp: 16  Temp: (!) 97.4 F (36.3 C)  TempSrc: Temporal  SpO2: 100%  Weight: 160 lb (72.6 kg)  Height: 5\' 9"   (1.753 m)   Body mass index is 23.63 kg/m. BP Readings from Last 3 Encounters:  05/24/23 118/78  01/31/23 132/74  08/04/21 120/80   Wt Readings from Last 3 Encounters:  05/24/23 160 lb (72.6 kg)  01/31/23 166 lb 6.4 oz (75.5 kg)  08/04/21 176 lb 6.4 oz (80 kg)    Physical Exam Constitutional:      Appearance: Normal appearance.  HENT:     Head: Normocephalic and atraumatic.  Cardiovascular:     Rate and Rhythm: Normal rate and regular rhythm.     Pulses: Normal pulses.     Heart sounds: Normal heart sounds.  Pulmonary:     Effort: No respiratory distress.     Breath sounds: No stridor. No wheezing or rales.  Abdominal:     General: Bowel sounds are normal. There is no distension.     Palpations: Abdomen is soft.     Tenderness: There is no abdominal tenderness. There is no right CVA tenderness or guarding.  Musculoskeletal:        General: No swelling.     Comments: Left ankle - point tenderness heel Pain elicited with talar tilt test and external rotation stress test  Neurological:     Mental Status: He is alert. Mental status is at baseline.     Sensory: No sensory deficit.     Motor: No weakness.     Labs reviewed: Basic Metabolic Panel: Recent Labs    01/31/23 1347  NA 142  K 4.3  CL 106  CO2 27  GLUCOSE 85  BUN 14  CREATININE 1.02  CALCIUM 9.3  TSH 0.65   Liver Function Tests: Recent Labs    01/31/23 1347  AST 23  ALT 26  BILITOT 0.5  PROT 6.7   No results for input(s): "LIPASE", "AMYLASE" in the last 8760 hours. No results for input(s): "AMMONIA" in the last 8760 hours. CBC: Recent Labs    01/31/23 1347  WBC 5.1  NEUTROABS 2,938  HGB 14.7  HCT 43.9  MCV 93.8  PLT 259   Lipid Panel: No results for input(s): "CHOL", "HDL", "LDLCALC", "TRIG", "CHOLHDL", "LDLDIRECT" in the last 8760 hours. TSH: Recent Labs    01/31/23 1347  TSH 0.65   A1C: No results found for: "HGBA1C"  Assessment and Plan    Ankle Sprain Pain and  swelling in the left ankle following a twisting injury at work over a week ago.  Pain is severe (7-8/10) with weight bearing and there is difficulty walking. No improvement with home management including rest, ice, elevation,  Order ankle x-ray to rule out fracture. Will order meloxicam -Advise to continue rest, ice, and elevation. -If  pain does not improve or if x-ray is inconclusive, consider ordering an MRI   -Follow-up in 1-2 weeks or sooner if symptoms worsen.          Return follow up with PCP, for X RAY ordered, he needs to go to [facility] imaging..:

## 2023-05-26 ENCOUNTER — Telehealth: Payer: Self-pay | Admitting: Sports Medicine

## 2023-05-26 NOTE — Telephone Encounter (Signed)
Patient states does he have to get a MRI? He is getting a little better because he has been resting. He said if it MRI is not needed then great

## 2023-05-26 NOTE — Telephone Encounter (Signed)
X ray ankle - negative

## 2023-09-13 ENCOUNTER — Encounter: Payer: Self-pay | Admitting: Family

## 2023-09-13 ENCOUNTER — Ambulatory Visit (INDEPENDENT_AMBULATORY_CARE_PROVIDER_SITE_OTHER): Admitting: Family

## 2023-09-13 ENCOUNTER — Ambulatory Visit: Payer: Self-pay

## 2023-09-13 VITALS — BP 124/80 | HR 61 | Temp 98.6°F | Ht 69.0 in | Wt 172.4 lb

## 2023-09-13 DIAGNOSIS — R143 Flatulence: Secondary | ICD-10-CM

## 2023-09-13 DIAGNOSIS — R21 Rash and other nonspecific skin eruption: Secondary | ICD-10-CM

## 2023-09-13 DIAGNOSIS — W57XXXA Bitten or stung by nonvenomous insect and other nonvenomous arthropods, initial encounter: Secondary | ICD-10-CM | POA: Diagnosis not present

## 2023-09-13 DIAGNOSIS — S80861A Insect bite (nonvenomous), right lower leg, initial encounter: Secondary | ICD-10-CM

## 2023-09-13 DIAGNOSIS — R61 Generalized hyperhidrosis: Secondary | ICD-10-CM | POA: Diagnosis not present

## 2023-09-13 MED ORDER — DOXYCYCLINE HYCLATE 100 MG PO TABS: 100.0000 mg | ORAL_TABLET | Freq: Two times a day (BID) | ORAL | 0 refills | Status: AC

## 2023-09-13 NOTE — Telephone Encounter (Addendum)
 Patient/caregiver understands and will follow disposition?: Copied from CRM [id number]. Topic: Clinical - Red Word Triage >> Sep 13, 2023  8:28 AM Tisa Forester wrote: Red Word that prompted transfer to Nurse Triage: tickbite on right calves with swelling and itching  Patient call back number [phone]  Pt transferred to nurse from agent.  Nurse verified name & DOB then pt stated he was bite by a tick a few weeks ago then the pt stated he was just pulling up a work therefore he would need to give us  a call back later - therefore nurse unable to complete triage.

## 2023-09-13 NOTE — Progress Notes (Signed)
 Provider: Christean Courts FNP-C  Rochanda Harpham, Elijio Guadeloupe, NP  Patient Care Team: Trimaine Maser, Elijio Guadeloupe, NP as PCP - General (Family Medicine)  Extended Emergency Contact Information Primary Emergency Contact: Hilma Lucks Address: [address]          [place], [place] [address] [place] Home Phone: [phone] Mobile Phone: [phone] Relation: Mother Secondary Emergency Contact: Aurther Lefevre Address: [address]          [place], [place] [address] [place] Home Phone: [phone] Mobile Phone: [phone] Relation: Father  Code Status:  Full Code  Goals of care: Advanced Directive information    05/24/2023   11:06 AM  Advanced Directives  Does Patient Have a Medical Advance Directive? No  Would patient like information on creating a medical advance directive? No - Patient declined     Chief Complaint  Patient presents with   Insect Bite    Tick bite on right calves with swelling and itching , this happen about 1 month ago,  just itching no pain , he said that he didn't have a fever at the  time getting bitten.     Discussed the use of AI scribe software for clinical note transcription with the patient, who gave verbal consent to proceed.  History of Present Illness   Curtis Howard is a 33 year old male who presents with a tick bite and persistent itchy bug bites.  He has a tick bite on his leg that occurred about a month ago. He removed the tick but is unsure if the head was completely removed. The site has scabbed over without a red ring, but there is tenderness around it. He did not receive any medication at the time of the bite and has not experienced any fevers or unusual body aches since then. No drainage from the tick bite.  In addition to the tick bite, he has been experiencing itchy bug bites, suspected to be from spiders, persisting longer than typical mosquito bites. These have been present for two to three weeks on his abdomen,  intermittently itchy, with decreased itching after applying cortisone cream. The bites have remained about the same size.  He has a history of hyperhidrosis, which has recently flared up, causing concern about excessive sweating. He is currently taking buspirone and Prozac  and wonders if these medications might be contributing to his symptoms.  He reports increased gas, both burping and flatulence, initially attributed to lactose intolerance, though his eating habits have not changed significantly. He has tried over-the-counter simethicone without much relief and is considering probiotics.  He had a rash on his chest three weeks ago, associated with antibiotics taken for a sinus infection. The rash resolved after discontinuing the antibiotics. No fever, diarrhea, and reports normal body aches. His weight has fluctuated between 160 and 180 pounds over the years, and his appetite is good.   Past Medical History:  Diagnosis Date   Acne    ADHD (attention deficit hyperactivity disorder)    Allergy    Anxiety    Asthma    Dysthymic disorder    Pneumonia    h/o   Reactive airway disease    Tobacco use disorder    Past Surgical History:  Procedure Laterality Date   HAND SURGERY  07/2018   WISDOM TOOTH EXTRACTION  04/11/2010    No Known Allergies  Outpatient Encounter Medications as of 09/13/2023  Medication Sig   busPIRone (BUSPAR) 10 MG tablet Take 10 mg by mouth  once.   doxycycline  (VIBRA -TABS) 100 MG tablet Take 1 tablet (100 mg total) by mouth 2 (two) times daily for 7 days.   FLUoxetine  (PROZAC ) 40 MG capsule Take 40 mg by mouth daily.   valACYclovir  (VALTREX ) 1000 MG tablet Take 1 tablet (1,000 mg total) by mouth daily.   cyclobenzaprine  (FLEXERIL ) 10 MG tablet Take 1 tablet (10 mg total) by mouth 3 (three) times daily as needed for muscle spasms.   FLUoxetine  (PROZAC ) 20 MG capsule Take 1 capsule (20 mg total) by mouth daily. (Patient not taking: Reported on 09/13/2023)   gabapentin   (NEURONTIN ) 100 MG capsule Take 1 capsule (100 mg total) by mouth 3 (three) times daily. (Patient not taking: Reported on 09/13/2023)   meloxicam  (MOBIC ) 15 MG tablet Take 1 tablet (15 mg total) by mouth daily. (Patient not taking: Reported on 09/13/2023)   No facility-administered encounter medications on file as of 09/13/2023.    Review of Systems  Constitutional:  Negative for appetite change, chills, fatigue, fever and unexpected weight change.  HENT:  Negative for congestion, dental problem, ear discharge, ear pain, facial swelling, hearing loss, nosebleeds, postnasal drip, rhinorrhea, sinus pressure, sinus pain, sneezing, sore throat, tinnitus and trouble swallowing.   Eyes:  Negative for pain, discharge, redness, itching and visual disturbance.  Respiratory:  Negative for cough, chest tightness, shortness of breath and wheezing.   Cardiovascular:  Negative for chest pain, palpitations and leg swelling.  Gastrointestinal:  Negative for abdominal distention, abdominal pain, blood in stool, constipation, diarrhea, nausea and vomiting.  Endocrine:       Excessive sweating   Genitourinary:  Negative for difficulty urinating, dysuria, flank pain, frequency and urgency.  Musculoskeletal:  Negative for arthralgias, back pain, gait problem, joint swelling, myalgias, neck pain and neck stiffness.  Skin:  Positive for rash. Negative for color change, pallor and wound.       Right calf muscle tick bite several weeks ago. Left side abdomen rash   Neurological:  Negative for dizziness, weakness, light-headedness, numbness and headaches.  Hematological:  Does not bruise/bleed easily.  Psychiatric/Behavioral:  Negative for agitation, behavioral problems, confusion, hallucinations and sleep disturbance. The patient is not nervous/anxious.     Immunization History  Administered Date(s) Administered   DTaP 03/30/1992, 05/29/1992, 07/30/1992, 05/21/1993, 07/29/1997   HIB (PRP-OMP) 03/30/1992, 05/29/1992,  07/30/1992, 05/21/1993   HPV Quadrivalent 09/01/2010   Hepatitis A 02/23/2007, 05/01/2009   Hepatitis B 02/13/1992, 03/30/1992, 07/30/1992   IPV 03/30/1992, 05/29/1992, 07/30/1992, 07/29/1997   MMR 05/21/1993, 07/29/1997   Meningococcal Conjugate 09/01/2010   Meningococcal polysaccharide vaccine (MPSV4) 02/23/2007   PFIZER Comirnaty(Gray Top)Covid-19 Tri-Sucrose Vaccine 07/02/2019, 07/23/2019   Tdap 09/09/2005   Pertinent  Health Maintenance Due  Topic Date Due   INFLUENZA VACCINE  11/10/2023      07/07/2021   10:37 AM 08/04/2021    2:41 PM 10/11/2021   11:46 AM 01/31/2023    1:04 PM 05/24/2023   11:05 AM  Fall Risk  Falls in the past year? 0 0 0 0 0  Was there an injury with Fall? 0 0 0 0 0  Fall Risk Category Calculator 0 0 0 0 0  Fall Risk Category (Retired) Low Low Low    (RETIRED) Patient Fall Risk Level Low fall risk Low fall risk Low fall risk    Patient at Risk for Falls Due to  No Fall Risks No Fall Risks No Fall Risks   Fall risk Follow up  Falls evaluation completed Falls evaluation completed Falls evaluation  completed    Functional Status Survey:    Vitals:   09/13/23 0951  BP: 124/80  Pulse: 61  Temp: 98.6 F (37 C)  TempSrc: Oral  SpO2: 98%  Weight: 172 lb 6.4 oz (78.2 kg)  Height: 5\' 9"  (1.753 m)   Body mass index is 25.46 kg/m. Physical Exam VITALS: T- 98.6, P- 61, BP- 124/80, SaO2- 98% MEASUREMENTS: Weight- 172.4. GENERAL: Alert, cooperative, well developed, no acute distress. HEENT: Normocephalic, normal oropharynx, moist mucous membranes. CHEST: Clear to auscultation bilaterally, no wheezes, rhonchi, or crackles. CARDIOVASCULAR: Normal heart rate and rhythm, S1 and S2 normal without murmurs. ABDOMEN: Soft, non-tender, non-distended, without organomegaly, normal bowel sounds. EXTREMITIES: No cyanosis or edema. NEUROLOGICAL: Cranial nerves grossly intact, moves all extremities without gross motor or sensory deficit. SKIN: Two itchy bites on left  abdomen, tender, no drainage. Scab on right leg, tender, no drainage or erythema.      Labs reviewed: Recent Labs    01/31/23 1347  NA 142  K 4.3  CL 106  CO2 27  GLUCOSE 85  BUN 14  CREATININE 1.02  CALCIUM 9.3   Recent Labs    01/31/23 1347  AST 23  ALT 26  BILITOT 0.5  PROT 6.7   Recent Labs    01/31/23 1347  WBC 5.1  NEUTROABS 2,938  HGB 14.7  HCT 43.9  MCV 93.8  PLT 259   Lab Results  Component Value Date   TSH 0.65 01/31/2023   No results found for: "HGBA1C" No results found for: "CHOL", "HDL", "LDLCALC", "LDLDIRECT", "TRIG", "CHOLHDL"  Significant Diagnostic Results in last 30 days:  No results found.  Assessment/Plan    Tick bite Tick bite on the right leg, present for about a month. No erythema, fever, or systemic symptoms. Tenderness noted, but no drainage, suggesting no retained tick parts. Uncertain if the tick head was completely removed. - Prescribe doxycycline  100 mg twice daily for 7 days to prevent potential infection. - Advise monitoring for signs of drainage or worsening symptoms and report if observed. - Counsel on complete tick removal to prevent infection. - Advise on sunscreen use and avoiding prolonged sun exposure while on doxycycline  to prevent sunburn.  Rash   Suspected spider bites Multiple itchy bites on the left abdomen, suspected to be spider bites, present for 2-3 weeks. Itchiness decreased with topical cortisone. No increase in size or significant change in appearance. - Continue topical cortisone for itch relief. - Prescribe doxycycline  100 mg twice daily for 7 days to cover potential secondary infection.  Hyperhidrosis Recurrent hyperhidrosis, possibly exacerbated by warmer weather. Difficulty finding effective deodorant. Potential contribution from current medications (Prozac ). - Refer to dermatology for further evaluation and management.  Flatulence  Increased gas production, both burping and flatulence, persistent  daily. Lactose intolerance considered but managed with lactase supplements. Simethicone tried without significant relief. - Recommend taking simethicone with meals, up to three times daily. - Suggest daily probiotic use, such as Align or [place], to help balance gut flora.  General Health Maintenance Discussion on the use of sunscreen and bug spray to prevent sunburn and insect bites, especially during summer. - Advise using sunscreen and bug spray when outdoors to prevent sunburn and insect bites.  Follow-up Follow-up plans discussed for dermatology referral and monitoring of tick and spider bites. - Expect a call from [facility] for an appointment. - Monitor tick and spider bite areas for signs of infection or lack of improvement and report if necessary.  Family/ staff Communication: Reviewed plan of care with patient verbalized understanding   Labs/tests ordered: None   Next Appointment: Return if symptoms worsen or fail to improve.   Total time: 20 minutes. Greater than 50% of total time spent doing patient education regarding Rash,Tick bite,Gas,Excessive sweating and health maintenance including symptom/medication management.   Estil Heman, NP

## 2023-09-13 NOTE — Telephone Encounter (Signed)
 Called and spoke patient made appt for today for patient to come to be seen.

## 2023-10-23 ENCOUNTER — Encounter: Payer: Self-pay | Admitting: Adult Health

## 2023-10-23 ENCOUNTER — Ambulatory Visit (INDEPENDENT_AMBULATORY_CARE_PROVIDER_SITE_OTHER): Payer: Self-pay | Admitting: Adult Health

## 2023-10-23 ENCOUNTER — Ambulatory Visit: Payer: Self-pay | Admitting: *Deleted

## 2023-10-23 VITALS — BP 122/68 | HR 79 | Temp 97.4°F | Resp 18 | Ht 69.0 in | Wt 173.6 lb

## 2023-10-23 DIAGNOSIS — R110 Nausea: Secondary | ICD-10-CM

## 2023-10-23 DIAGNOSIS — F411 Generalized anxiety disorder: Secondary | ICD-10-CM

## 2023-10-23 DIAGNOSIS — F330 Major depressive disorder, recurrent, mild: Secondary | ICD-10-CM | POA: Diagnosis not present

## 2023-10-23 DIAGNOSIS — K529 Noninfective gastroenteritis and colitis, unspecified: Secondary | ICD-10-CM | POA: Diagnosis not present

## 2023-10-23 LAB — CBC WITH DIFFERENTIAL/PLATELET
Absolute Lymphocytes: 1176 {cells}/uL (ref 850–3900)
Absolute Monocytes: 480 {cells}/uL (ref 200–950)
Basophils Absolute: 10 {cells}/uL (ref 0–200)
Basophils Relative: 0.2 %
Eosinophils Absolute: 69 {cells}/uL (ref 15–500)
Eosinophils Relative: 1.4 %
HCT: 45.3 % (ref 38.5–50.0)
Hemoglobin: 15.1 g/dL (ref 13.2–17.1)
MCH: 31 pg (ref 27.0–33.0)
MCHC: 33.3 g/dL (ref 32.0–36.0)
MCV: 93 fL (ref 80.0–100.0)
MPV: 9 fL (ref 7.5–12.5)
Monocytes Relative: 9.8 %
Neutro Abs: 3165 {cells}/uL (ref 1500–7800)
Neutrophils Relative %: 64.6 %
Platelets: 226 Thousand/uL (ref 140–400)
RBC: 4.87 Million/uL (ref 4.20–5.80)
RDW: 12.2 % (ref 11.0–15.0)
Total Lymphocyte: 24 %
WBC: 4.9 Thousand/uL (ref 3.8–10.8)

## 2023-10-23 LAB — COMPREHENSIVE METABOLIC PANEL WITH GFR
AG Ratio: 2 (calc) (ref 1.0–2.5)
ALT: 29 U/L (ref 9–46)
AST: 23 U/L (ref 10–40)
Albumin: 4.6 g/dL (ref 3.6–5.1)
Alkaline phosphatase (APISO): 48 U/L (ref 36–130)
BUN: 19 mg/dL (ref 7–25)
CO2: 27 mmol/L (ref 20–32)
Calcium: 8.9 mg/dL (ref 8.6–10.3)
Chloride: 103 mmol/L (ref 98–110)
Creat: 0.89 mg/dL (ref 0.60–1.26)
Globulin: 2.3 g/dL (ref 1.9–3.7)
Glucose, Bld: 102 mg/dL — ABNORMAL HIGH (ref 65–99)
Potassium: 3.6 mmol/L (ref 3.5–5.3)
Sodium: 138 mmol/L (ref 135–146)
Total Bilirubin: 0.6 mg/dL (ref 0.2–1.2)
Total Protein: 6.9 g/dL (ref 6.1–8.1)
eGFR: 117 mL/min/1.73m2 (ref >=60)

## 2023-10-23 MED ORDER — ONDANSETRON HCL 4 MG PO TABS: 4.0000 mg | ORAL_TABLET | Freq: Three times a day (TID) | ORAL | 0 refills | Status: AC | PRN

## 2023-10-23 NOTE — Telephone Encounter (Signed)
 FYI Only or Action Required?: FYI only for provider.  Patient was last seen in primary care on 09/13/2023 by Ngetich, Roxan BROCKS, NP.  Called Nurse Triage reporting Diarrhea.  Symptoms began yesterday.  Interventions attempted: Rest, hydration, or home remedies.  Symptoms are: gradually improving.  Triage Disposition: See Physician Within 24 Hours  Patient/caregiver understands and will follow disposition?: yes   Reason for Disposition  [1] SEVERE diarrhea (e.g., 7 or more times / day more than normal) AND [2] present > 24 hours (1 day)  Answer Assessment - Initial Assessment Questions 1. DIARRHEA SEVERITY: How bad is the diarrhea? How many more stools have you had in the past 24 hours than normal?      6-7 2. ONSET: When did the diarrhea begin?      Yesterday noon 3. STOOL DESCRIPTION:  How loose or watery is the diarrhea? What is the stool color? Is there any blood or mucous in the stool?     watery 4. VOMITING: Are you also vomiting? If Yes, ask: How many times in the past 24 hours?      Yesterday- stopped last night- twice 5. ABDOMEN PAIN: Are you having any abdomen pain? If Yes, ask: What does it feel like? (e.g., crampy, dull, intermittent, constant)      Not so bad- more body ache/chills 6. ABDOMEN PAIN SEVERITY: If present, ask: How bad is the pain?  (e.g., Scale 1-10; mild, moderate, or severe)     2/10 7. ORAL INTAKE: If vomiting, Have you been able to drink liquids? How much liquids have you had in the past 24 hours?     Yes- 100 oz 8. HYDRATION: Any signs of dehydration? (e.g., dry mouth [not just dry lips], too weak to stand, dizziness, new weight loss) When did you last urinate?     headache 9. EXPOSURE: Have you traveled to a foreign country recently? Have you been exposed to anyone with diarrhea? Could you have eaten any food that was spoiled?     Milkshake- suspect 10. ANTIBIOTIC USE: Are you taking antibiotics now or have you  taken antibiotics in the past 2 months?       yes 11. OTHER SYMPTOMS: Do you have any other symptoms? (e.g., fever, blood in stool)       Low graed temp last night  Protocols used: [facility]   Copied from CRM [id number]. Topic: Clinical - Red Word Triage >> Oct 23, 2023 12:23 PM Alfonso ORN wrote: Red Word that prompted transfer to Nurse Triage: possible food poisoning started yesterday  vomiting extreme weakness , chills, bodyaches diarrhea  call back [phone]

## 2023-10-23 NOTE — Progress Notes (Signed)
 [facility] clinic  Provider:  Jereld Serum DNP  Code Status:  Full Code  Goals of Care:     05/24/2023   11:06 AM  Advanced Directives  Does Patient Have a Medical Advance Directive? No  Would patient like information on creating a medical advance directive? No - Patient declined     Chief Complaint  Patient presents with   Abdominal Pain    Diarrhea, vomiting, mild abdominal pain    Discussed the use of AI scribe software for clinical note transcription with the patient, who gave verbal consent to proceed.  HPI: Patient is a 33 y.o. male seen today for an acute visit for diarrhea, vomiting, mild abdominal pain.  He has been experiencing vomiting and diarrhea for approximately 18 to 24 hours. Vomiting occurred twice, once in the afternoon and once at night, and diarrhea occurred 7 to 10 times yesterday. He suspects the cause might be a milkshake consumed two days ago from a place where they were unsure about the cleanliness of the machines. He also consumed a hot dog and pizza from different places on the same day.  He experienced a slight fever of 76F last night, along with chills, headache, and extreme fatigue. The headache persists, and they report abdominal pain rated as a 5 out of 10, which was worse yesterday. No blood in the stool, but it is described as watery and painful. His last moved their bowels about an hour before the visit.  He is currently taking Buspirone 10 mg twice daily for anxiety, which has improved slightly but remains severe. He also take Prozac  40 mg daily for depression, which is much better. Additionally, he take Valtrex  for HSV-1. He reports a history of lower back pain after a car hood fell on their back, for which they occasionally take Flexeril  20 mg as needed, though they have not used it recently.  In the review of symptoms, they confirm nausea, vomiting, diarrhea, and abdominal pain. No blood in the stool. They report that the diarrhea has a  smell but is not 'horrible'. He did not worked today due to their symptoms.    Past Medical History:  Diagnosis Date   Acne    ADHD (attention deficit hyperactivity disorder)    Allergy    Anxiety    Asthma    Dysthymic disorder    Pneumonia    h/o   Reactive airway disease    Tobacco use disorder     Past Surgical History:  Procedure Laterality Date   HAND SURGERY  07/2018   WISDOM TOOTH EXTRACTION  04/11/2010    No Known Allergies  Outpatient Encounter Medications as of 10/23/2023  Medication Sig   busPIRone (BUSPAR) 10 MG tablet Take 10 mg by mouth once.   cyclobenzaprine  (FLEXERIL ) 10 MG tablet Take 1 tablet (10 mg total) by mouth 3 (three) times daily as needed for muscle spasms.   FLUoxetine  (PROZAC ) 40 MG capsule Take 40 mg by mouth daily.   ondansetron  (ZOFRAN ) 4 MG tablet Take 1 tablet (4 mg total) by mouth every 8 (eight) hours as needed for nausea or vomiting.   valACYclovir  (VALTREX ) 1000 MG tablet Take 1 tablet (1,000 mg total) by mouth daily.   No facility-administered encounter medications on file as of 10/23/2023.    Review of Systems:  Review of Systems  Constitutional:  Negative for activity change, appetite change and fever.  HENT:  Negative for sore throat.   Eyes: Negative.   Cardiovascular:  Negative  for chest pain and leg swelling.  Gastrointestinal:  Positive for abdominal pain, diarrhea and nausea. Negative for abdominal distention, blood in stool and vomiting.  Genitourinary:  Negative for dysuria, frequency and urgency.  Skin:  Negative for color change.  Neurological:  Negative for dizziness and headaches.  Psychiatric/Behavioral:  Negative for behavioral problems and sleep disturbance. The patient is not nervous/anxious.     Health Maintenance  Topic Date Due   HPV VACCINES (2 - Male 3-dose series) 09/29/2010   DTaP/Tdap/Td (7 - Td or Tdap) 09/10/2015   COVID-19 Vaccine (3 - 2024-25 season) 11/10/2023 (Originally 12/11/2022)   INFLUENZA  VACCINE  11/10/2023   Hepatitis B Vaccines  Completed   Hepatitis C Screening  Completed   HIV Screening  Completed   Meningococcal B Vaccine  Aged Out    Physical Exam: Vitals:   10/23/23 1316  BP: 122/68  Pulse: 79  Resp: 18  Temp: (!) 97.4 F (36.3 C)  SpO2: 99%  Weight: 173 lb 9.6 oz (78.7 kg)  Height: 5' 9 (1.753 m)   Body mass index is 25.64 kg/m. Physical Exam Constitutional:      Appearance: Normal appearance.  HENT:     Head: Normocephalic and atraumatic.     Mouth/Throat:     Mouth: Mucous membranes are moist.  Eyes:     Conjunctiva/sclera: Conjunctivae normal.  Cardiovascular:     Rate and Rhythm: Normal rate and regular rhythm.     Pulses: Normal pulses.     Heart sounds: Normal heart sounds.  Pulmonary:     Effort: Pulmonary effort is normal.     Breath sounds: Normal breath sounds.  Abdominal:     General: Bowel sounds are normal.     Palpations: Abdomen is soft.  Musculoskeletal:        General: No swelling. Normal range of motion.     Cervical back: Normal range of motion.  Skin:    General: Skin is warm and dry.  Neurological:     General: No focal deficit present.     Mental Status: He is alert and oriented to person, place, and time.  Psychiatric:        Mood and Affect: Mood normal.        Behavior: Behavior normal.        Thought Content: Thought content normal.        Judgment: Judgment normal.     Labs reviewed: Basic Metabolic Panel: Recent Labs    01/31/23 1347  NA 142  K 4.3  CL 106  CO2 27  GLUCOSE 85  BUN 14  CREATININE 1.02  CALCIUM 9.3  TSH 0.65   Liver Function Tests: Recent Labs    01/31/23 1347  AST 23  ALT 26  BILITOT 0.5  PROT 6.7   No results for input(s): LIPASE, AMYLASE in the last 8760 hours. No results for input(s): AMMONIA in the last 8760 hours. CBC: Recent Labs    01/31/23 1347  WBC 5.1  NEUTROABS 2,938  HGB 14.7  HCT 43.9  MCV 93.8  PLT 259   Lipid Panel: No results for  input(s): CHOL, HDL, LDLCALC, TRIG, CHOLHDL, LDLDIRECT in the last 8760 hours. No results found for: HGBA1C  Procedures since last visit: No results found.  Assessment/Plan  1. Acute gastroenteritis (Primary) -  Symptoms suggest acute gastroenteritis, likely from recent milkshake consumption. Differential includes Salmonella and C. diff. - Order stool culture for Salmonella and C. diff. - Order CBC. - Order  BMP. - Order liver function tests. - Prescribe Zofran  4 mg every 4 hours as needed for nausea. - Advise clear liquid diet and low-fat foods. - Comprehensive metabolic panel - CBC with Differential/Platelets - Clostridium difficile culture-fecal - Salmonella/Shigella Cult, Campy EIA and Shiga Toxin reflex  2. Major depressive disorder, recurrent episode, mild (HCC) -  Depression significantly improved with Prozac . - Continue Prozac  as prescribed.  3. Generalized anxiety disorder -  Severe anxiety slightly improved with Buspirone. - Continue Buspirone 10 mg twice daily.  4. Nausea - ondansetron  (ZOFRAN ) 4 MG tablet; Take 1 tablet (4 mg total) by mouth every 8 (eight) hours as needed for nausea or vomiting.  Dispense: 20 tablet; Refill: 0         Labs/tests ordered:   stool culture for c-difficile and salmonella, CBC, CMP   Return if symptoms worsen or fail to improve.  Milad Bublitz Medina-Vargas, NP

## 2023-10-25 ENCOUNTER — Ambulatory Visit: Payer: Self-pay | Admitting: Adult Health

## 2023-10-25 NOTE — Progress Notes (Signed)
-     Electrolytes, liver enzymes, WBC, all normal -   No anemia -   Awaiting culture results

## 2023-11-16 ENCOUNTER — Other Ambulatory Visit: Payer: Self-pay

## 2023-11-16 DIAGNOSIS — A6002 Herpesviral infection of other male genital organs: Secondary | ICD-10-CM

## 2023-11-16 MED ORDER — VALACYCLOVIR HCL 1 G PO TABS: 1000.0000 mg | ORAL_TABLET | Freq: Every day | ORAL | 1 refills | Status: DC

## 2023-12-01 ENCOUNTER — Other Ambulatory Visit: Payer: Self-pay | Admitting: Family

## 2024-02-05 ENCOUNTER — Ambulatory Visit (INDEPENDENT_AMBULATORY_CARE_PROVIDER_SITE_OTHER): Payer: BC Managed Care – PPO | Admitting: Family

## 2024-02-05 ENCOUNTER — Encounter: Payer: Self-pay | Admitting: Family

## 2024-02-05 VITALS — BP 122/72 | Temp 98.0°F | Resp 20 | Ht 69.0 in | Wt 180.4 lb

## 2024-02-05 DIAGNOSIS — A6002 Herpesviral infection of other male genital organs: Secondary | ICD-10-CM

## 2024-02-05 DIAGNOSIS — L989 Disorder of the skin and subcutaneous tissue, unspecified: Secondary | ICD-10-CM

## 2024-02-05 DIAGNOSIS — F330 Major depressive disorder, recurrent, mild: Secondary | ICD-10-CM

## 2024-02-05 DIAGNOSIS — F411 Generalized anxiety disorder: Secondary | ICD-10-CM

## 2024-02-05 DIAGNOSIS — K219 Gastro-esophageal reflux disease without esophagitis: Secondary | ICD-10-CM

## 2024-02-05 DIAGNOSIS — M62830 Muscle spasm of back: Secondary | ICD-10-CM

## 2024-02-05 MED ORDER — VALACYCLOVIR HCL 1 G PO TABS: 1000.0000 mg | ORAL_TABLET | Freq: Every day | ORAL | 1 refills | Status: DC

## 2024-02-05 MED ORDER — FLUOXETINE HCL 40 MG PO CAPS: 40.0000 mg | ORAL_CAPSULE | Freq: Every day | ORAL | 3 refills | Status: AC

## 2024-02-05 MED ORDER — CYCLOBENZAPRINE HCL 10 MG PO TABS: 10.0000 mg | ORAL_TABLET | Freq: Three times a day (TID) | ORAL | 3 refills | Status: AC | PRN

## 2024-02-14 NOTE — Progress Notes (Signed)
 Provider: Roxan Plough FNP-C   Jeanean Hollett, Roxan BROCKS, NP  Patient Care Team: Yvette Roark, Roxan BROCKS, NP as PCP - General (Family Medicine)  Extended Emergency Contact Information Primary Emergency Contact: Bertrum Niels ORN Address: [address]          [place], [place] [address] [place] Home Phone: [phone] Mobile Phone: [phone] Relation: Mother Secondary Emergency Contact: Bertrum Bare Address: [address]          [place], [place] [address] [place] Home Phone: [phone] Mobile Phone: [phone] Relation: Father  Code Status:  Full Code  Goals of care: Advanced Directive information    02/05/2024   11:09 AM  Advanced Directives  Does Patient Have a Medical Advance Directive? No  Would patient like information on creating a medical advance directive? No - Patient declined     Chief Complaint  Patient presents with   Follow-up    1 Year follow up.    Discussed the use of AI scribe software for clinical note transcription with the patient, who gave verbal consent to proceed.  History of Present Illness   LEOVARDO THOMAN is a 33 year old male who presents for follow-up after experiencing tick bites and abdominal pain.  In July, he experienced two tick bites, one of which has persisted and continues to itch, especially when scratched. He is unsure if the entire tick was removed, and the site has not shown improvement over the months, raising concern about its prolonged presence.  In July, he experienced mild abdominal pain, diarrhea, and vomiting, which were attributed to acute gastroenteritis. He was prescribed Zofran  4 mg as needed for nausea, which he took and still has some remaining. These symptoms have since resolved.  He is currently taking buspirone 10 mg twice daily for anxiety and Prozac  for depression, both prescribed by his psychiatrist. His depression has worsened slightly, possibly due to a recent breakup, but he is  trying to manage it by exercising more and spending time outdoors. He also sees a therapist every two weeks.  He reports sleeping ten hours a night but still feels tired during the day, which he attributes to emotional factors. He also takes Valtrex  1000 mg daily and uses Flexeril  occasionally for muscle spasms in his lower back, which he describes as a dull, persistent pain that can become spasmodic with overexertion.  Recent lab work indicated a glucose level of 102, which he recalls was slightly elevated, possibly due to not fasting and being unwell at the time. Previous glucose levels were 85. Other lab results, including white blood cells, hemoglobin, liver, electrolytes, and kidney function, were within normal limits.  No current abdominal pain, constipation, or significant allergy issues. Occasional knee pain but not frequent.   Past Medical History:  Diagnosis Date   Acne    ADHD (attention deficit hyperactivity disorder)    Allergy    Anxiety    Asthma    Dysthymic disorder    Pneumonia    h/o   Reactive airway disease    Tobacco use disorder    Past Surgical History:  Procedure Laterality Date   HAND SURGERY  07/2018   WISDOM TOOTH EXTRACTION  04/11/2010    No Known Allergies  Allergies as of 02/05/2024   No Known Allergies      Medication List        Accurate as of February 05, 2024 11:59 PM. If you have any questions, ask your nurse or doctor.  busPIRone 10 MG tablet Commonly known as: BUSPAR Take 10 mg by mouth once.   cyclobenzaprine  10 MG tablet Commonly known as: FLEXERIL  Take 1 tablet (10 mg total) by mouth 3 (three) times daily as needed for muscle spasms.   FLUoxetine  40 MG capsule Commonly known as: PROZAC  Take 1 capsule (40 mg total) by mouth daily.   ondansetron  4 MG tablet Commonly known as: Zofran  Take 1 tablet (4 mg total) by mouth every 8 (eight) hours as needed for nausea or vomiting.   valACYclovir  1000 MG tablet Commonly  known as: VALTREX  Take 1 tablet (1,000 mg total) by mouth daily.        Review of Systems  Constitutional:  Negative for appetite change, chills, fatigue, fever and unexpected weight change.  HENT:  Negative for congestion, dental problem, ear discharge, ear pain, hearing loss, nosebleeds, postnasal drip, rhinorrhea, sinus pressure, sinus pain, sneezing, sore throat, tinnitus and trouble swallowing.   Eyes:  Negative for pain, discharge, redness, itching and visual disturbance.  Respiratory:  Negative for cough, chest tightness, shortness of breath and wheezing.   Cardiovascular:  Negative for chest pain, palpitations and leg swelling.  Gastrointestinal:  Negative for abdominal distention, abdominal pain, blood in stool, constipation, diarrhea, nausea and vomiting.  Endocrine: Negative for cold intolerance, heat intolerance, polydipsia, polyphagia and polyuria.  Genitourinary:  Negative for difficulty urinating, dysuria, flank pain, frequency and urgency.  Musculoskeletal:  Positive for back pain. Negative for arthralgias, gait problem, joint swelling, myalgias, neck pain and neck stiffness.       Spasm lower back   Skin:  Negative for color change, pallor, rash and wound.  Neurological:  Negative for dizziness, syncope, speech difficulty, weakness, light-headedness, numbness and headaches.  Hematological:  Does not bruise/bleed easily.  Psychiatric/Behavioral:  Negative for agitation, behavioral problems, confusion, hallucinations, self-injury, sleep disturbance and suicidal ideas. The patient is not nervous/anxious.        Depression follows up with Therapist and Psychiatry service     Immunization History  Administered Date(s) Administered   DTaP 03/30/1992, 05/29/1992, 07/30/1992, 05/21/1993, 07/29/1997   HIB (PRP-OMP) 03/30/1992, 05/29/1992, 07/30/1992, 05/21/1993   HPV Quadrivalent 09/01/2010   Hepatitis A 02/23/2007, 05/01/2009   Hepatitis A, Ped/Adol-2 Dose 02/23/2007,  05/01/2009   Hepatitis B 02/13/1992, 03/30/1992, 07/30/1992   IPV 03/30/1992, 05/29/1992, 07/30/1992, 07/29/1997   MMR 05/21/1993, 07/29/1997   Meningococcal Conjugate 09/01/2010   Meningococcal polysaccharide vaccine (MPSV4) 02/23/2007   PFIZER Comirnaty(Gray Top)Covid-19 Tri-Sucrose Vaccine 07/02/2019, 07/23/2019   Tdap 09/09/2005   Pertinent  Health Maintenance Due  Topic Date Due   Influenza Vaccine  08/20/2024 (Originally 11/10/2023)      08/04/2021    2:41 PM 10/11/2021   11:46 AM 01/31/2023    1:04 PM 05/24/2023   11:05 AM 02/05/2024   11:09 AM  Fall Risk  Falls in the past year? 0 0 0 0 0  Was there an injury with Fall? 0 0 0 0 0  Fall Risk Category Calculator 0 0 0 0 0  Fall Risk Category (Retired) Low  Low      (RETIRED) Patient Fall Risk Level Low fall risk  Low fall risk      Patient at Risk for Falls Due to No Fall Risks No Fall Risks No Fall Risks  No Fall Risks  Fall risk Follow up Falls evaluation completed  Falls evaluation completed  Falls evaluation completed  Falls evaluation completed     Data saved with a previous flowsheet row definition  Functional Status Survey:    Vitals:   02/05/24 1111  BP: 122/72  Resp: 20  Temp: 98 F (36.7 C)  Weight: 180 lb 6.4 oz (81.8 kg)  Height: 5' 9 (1.753 m)   Body mass index is 26.64 kg/m. Physical Exam GENERAL: Alert, cooperative, well developed, no acute distress. HEENT: Normocephalic, normal oropharynx, moist mucous membranes, ears normal without infection, nose normal, no sinus tenderness. CHEST: Clear to auscultation bilaterally, no wheezes, rhonchi, or crackles. CARDIOVASCULAR: Normal heart rate and rhythm, S1 and S2 normal without murmurs. ABDOMEN: Soft, non-tender, non-distended, without organomegaly, normal bowel sounds, liver normal. EXTREMITIES: No cyanosis or edema, no leg swelling, no numbness or tingling. MUSCULOSKELETAL: Hips normal range of motion, non-tender, shoulders normal. NEUROLOGICAL:  Cranial nerves grossly intact, moves all extremities without gross motor or sensory deficit. SKIN: No redness or ring around tick bite. PSYCHIATRY/BEHAVIORAL: Mood stable   Labs reviewed: Recent Labs    10/23/23 1446  NA 138  K 3.6  CL 103  CO2 27  GLUCOSE 102*  BUN 19  CREATININE 0.89  CALCIUM 8.9   Recent Labs    10/23/23 1446  AST 23  ALT 29  BILITOT 0.6  PROT 6.9   Recent Labs    10/23/23 1446  WBC 4.9  NEUTROABS 3,165  HGB 15.1  HCT 45.3  MCV 93.0  PLT 226   Lab Results  Component Value Date   TSH 0.65 01/31/2023   No results found for: HGBA1C No results found for: CHOL, HDL, LDLCALC, LDLDIRECT, TRIG, CHOLHDL  Significant Diagnostic Results in last 30 days:  No results found.  Assessment/Plan    Depression and generalized anxiety disorder Depression has worsened, potentially exacerbated by a recent breakup. He is engaging in exercise and outdoor activities to manage symptoms. Continues to see a therapist biweekly and a psychiatrist for medication management. Currently on buspirone 10 mg twice daily and Prozac , prescribed by his psychiatrist. - Refill Prozac  prescription if out of refills. - Continue buspirone 10 mg twice daily. - Encourage continuation of therapy sessions every two weeks.  GERD Symptoms controlled   Chronic low back pain with intermittent muscle spasm Chronic low back pain with occasional muscle spasms, described as a dull, persistent pain that can become spasmodic with overexertion. Pain is primarily in the lower back and hips. - Continue Flexeril  as needed for muscle spasms. - Encourage stretching exercises to loosen muscles and prevent spasms.  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 The lesion itches intermittently, particularly when scratched. No erythema or ring formation observed. Concern for persistent lesion without healing. - Refer  to dermatologist for evaluation of the chronic lesion at the tick bite site.  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 - Continue Valtrex  1000 mg daily.  General Health Maintenance Recent lab work showed glucose level of 102 mg/dL, slightly elevated, but not fasting. Previous glucose level was 85 mg/dL. No immediate concerns but advised monitoring. - Encourage healthy diet and regular exercise. - Plan for full blood work at next visit in six months. - Monitor glucose levels; if elevated again, consider further evaluation for diabetes.  Family/ staff Communication: Reviewed plan of care with patient verbalized understanding   Labs/tests ordered: None   Next Appointment : Return in about 6 months (around 08/05/2024) for medical mangement of chronic issues., Fasting labs in 6 months prior to visit.  Spent 30 minutes of Face to face and non-face to face with patient  >50% time spent counseling; reviewing medical record; tests; labs; documentation and developing future plan of care.   Roxan JAYSON Plough, NP

## 2024-04-16 ENCOUNTER — Ambulatory Visit: Admitting: Physician Assistant

## 2024-05-08 ENCOUNTER — Other Ambulatory Visit: Payer: Self-pay | Admitting: Family

## 2024-05-08 DIAGNOSIS — A6002 Herpesviral infection of other male genital organs: Secondary | ICD-10-CM

## 2024-07-29 ENCOUNTER — Other Ambulatory Visit: Payer: Self-pay

## 2024-08-05 ENCOUNTER — Ambulatory Visit: Payer: Self-pay | Admitting: Family

## 2024-12-17 ENCOUNTER — Ambulatory Visit: Admitting: Physician Assistant
